# Patient Record
Sex: Female | Born: 1990 | Race: White | Hispanic: No | Marital: Married | Smoking: Never smoker
Health system: Southern US, Community
[De-identification: ages and names within clinical notes are randomized; demographics above are authoritative.]

## PROBLEM LIST (undated history)

## (undated) ENCOUNTER — Inpatient Hospital Stay (HOSPITAL_COMMUNITY): Payer: Self-pay

## (undated) DIAGNOSIS — G43909 Migraine, unspecified, not intractable, without status migrainosus: Secondary | ICD-10-CM

## (undated) DIAGNOSIS — F909 Attention-deficit hyperactivity disorder, unspecified type: Secondary | ICD-10-CM

## (undated) DIAGNOSIS — N83209 Unspecified ovarian cyst, unspecified side: Secondary | ICD-10-CM

## (undated) HISTORY — DX: Migraine, unspecified, not intractable, without status migrainosus: G43.909

## (undated) HISTORY — DX: Attention-deficit hyperactivity disorder, unspecified type: F90.9

## (undated) HISTORY — PX: WISDOM TOOTH EXTRACTION: SHX21

---
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
## 2011-06-20 ENCOUNTER — Institutional Professional Consult (permissible substitution): Payer: Self-pay | Admitting: Family Medicine

## 2011-10-24 ENCOUNTER — Encounter: Payer: Self-pay | Admitting: *Deleted

## 2011-10-31 ENCOUNTER — Encounter: Payer: Self-pay | Admitting: Family Medicine

## 2011-10-31 ENCOUNTER — Ambulatory Visit (INDEPENDENT_AMBULATORY_CARE_PROVIDER_SITE_OTHER): Payer: 59 | Admitting: Family Medicine

## 2011-10-31 VITALS — BP 124/86 | HR 80 | Ht 70.0 in | Wt 294.0 lb

## 2011-10-31 DIAGNOSIS — Z8349 Family history of other endocrine, nutritional and metabolic diseases: Secondary | ICD-10-CM

## 2011-10-31 DIAGNOSIS — L659 Nonscarring hair loss, unspecified: Secondary | ICD-10-CM

## 2011-10-31 DIAGNOSIS — Z8342 Family history of familial hypercholesterolemia: Secondary | ICD-10-CM

## 2011-10-31 DIAGNOSIS — Z6841 Body Mass Index (BMI) 40.0 and over, adult: Secondary | ICD-10-CM

## 2011-10-31 DIAGNOSIS — R5381 Other malaise: Secondary | ICD-10-CM

## 2011-10-31 DIAGNOSIS — F909 Attention-deficit hyperactivity disorder, unspecified type: Secondary | ICD-10-CM

## 2011-10-31 MED ORDER — LISDEXAMFETAMINE DIMESYLATE 50 MG PO CAPS: 50.0000 mg | ORAL_CAPSULE | ORAL | Status: DC

## 2011-10-31 NOTE — Patient Instructions (Signed)
HEALTH MAINTENANCE RECOMMENDATIONS:  It is recommended that you get at least 30 minutes of aerobic exercise at least 5 days/week (for weight loss, you may need as much as 60-90 minutes). This can be any activity that gets your heart rate up. This can be divided in 10-15 minute intervals if needed, but try and build up your endurance at least once a week.  Weight bearing exercise is also recommended twice weekly.  Eat a healthy diet with lots of vegetables, fruits and fiber.  "Colorful" foods have a lot of vitamins (ie green vegetables, tomatoes, red peppers, etc).  Limit sweet tea, regular sodas and alcoholic beverages, all of which has a lot of calories and sugar.  Up to 1 alcoholic drink daily may be beneficial for women (unless trying to lose weight, watch sugars).  Drink a lot of water.  Calcium recommendations are 1200-1500 mg daily (1500 mg for postmenopausal women or women without ovaries), and vitamin D 1000 IU daily.  This should be obtained from diet and/or supplements (vitamins), and calcium should not be taken all at once, but in divided doses.  Try and drink plenty of water, change to skim milk.  Avoid juices and gatorade. Try and get at least 8 hours of sleep each night.  Start the Vyvanse in the mornings on the days of classes.

## 2011-10-31 NOTE — Progress Notes (Signed)
Chief Complaint  Patient presents with  . Establish Care    new patient to get established. Thinks she may have possiblet thyroid problems, she cannot lose weight-no matter what she does. Having "bathroom issues." Patient states that about 30 minutes after eating any meal she has a bowel movement-not diarrhea, normal bowel movement.   HPI: She presents today accompanied by her mother, to discuss her weight and her thyroid, and also requesting to restart ADHD medication after having been off for many years.  Her pediatrician had mentioned getting thyroid checked due to her weight and family history of thyroid disease (this was years ago); has never been checked.  Denies any weight changes in the last few years.  Has been trying to lose and hasn't been able.  Went to the gym daily for a year 1-2 years ago, but didn't lose weight.  Hasn't really tried anything since then.  Plays "Just Dance" video game. Exercise--none.  +juice (Gatorade) daily.  No change in bowel habits.  Has always needed to use the bathroom to have a bowel movement shortly after eating, and sometimes even during meal.  Stools are not loose or hard, no pain or straining.  Denies blood or mucus in stool. Mostly occurs with larger meals, but mother states with any meals, and not related to dairy.  Reports being hot all the time.  Denies fatigue, but mother states she always complains about being tired--but relates that to not sleeping well at night.  Denies skin changes, but does report some hair loss and thinning.  Just recently started taking the B vitamins to help with her hair loss.  ADHD--wants to be put back on Vyvanse.  Has been off meds since 8th grade.  She refused to take it while in high school (there were some issues with mom's husband stealing her medications).  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  Denied side effects or problems on meds in past.  Records reviewed--took Adderall until she maxed out on dose, was  then changed to 50mg  of Vyvanse (2/09).  H/o migraines--none since being on OCP's  Past Medical History  Diagnosis Date  . ADHD (attention deficit hyperactivity disorder)   . Migraines    History reviewed. No pertinent past surgical history.  History   Social History  . Marital Status: Single    Spouse Name: N/A    Number of Children: N/A  . Years of Education: N/A   Occupational History  . 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 Smoking status: Never Smoker   . Smokeless tobacco: Never Used  . Alcohol Use: No  . Drug Use: No  . Sexually Active: Not on file   Other Topics Concern  . Not on file   Social History Narrative   Lives with mom, 1 dog (black lab mix).  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 Ulcers Father   . Diabetes Maternal Aunt     borderline  . Fibromyalgia Maternal Aunt   . Cancer Maternal Aunt     cervical  . Diabetes Maternal Grandmother   . Hyperlipidemia Maternal Grandmother   . Thyroid disease Maternal Grandmother     hyperthyroidism, s/p radiactive iodine treatment  . Heart disease Maternal Grandmother     mitral valve problem  . Cancer Maternal Grandmother     skin  .  Cancer Cousin     breast cancer    Current outpatient prescriptions:Multiple Vitamins-Minerals (MULTIVITAMIN WITH MINERALS) tablet, Take 1 tablet by mouth daily., Disp: , Rfl: ;  Norethin-Eth Estrad-Fe Biphas (LO LOESTRIN FE PO), Take 1 tablet by mouth daily., Disp: , Rfl: ;  pyridoxine (B-6) 200 MG tablet, Take 200 mg by mouth daily., Disp: , Rfl: ;  vitamin B-12 (CYANOCOBALAMIN) 100 MCG tablet, Take 100 mcg by mouth daily., Disp: , Rfl:   Allergies  Allergen Reactions  . Lemonade Flavor Information systems manager) Other (See Comments)    "Great Value" brand lemonade made her lips swell.   ROS:  Denies fevers, URI symptoms, chest pain, shortness of breath,  nausea, vomiting, bowel changes, urinary complaints, skin concerns. +acne, +difficulty concentrating, +fatigue--poor sleep. No weight changes. +hair loss.  No joint pains, depression  PHYSICAL EXAM: BP 124/86  Pulse 80  Ht 5\' 10"  (1.778 m)  Wt 294 lb (133.358 kg)  BMI 42.18 kg/m2  LMP 10/26/2011 Pleasant female in no distress HEENT:  PERRL, EOMI, conjunctiva clear.  OP clear Neck: no lymphadenopathy, thyromegaly or mass Heart: regular rate and rhythm without murmur Lungs: clear bilaterally Back: no spine or CVA tenderness Abdomen: soft, nontender, no organomegaly or mass Extremities: no edema, 2+ pulse Skin: moderate acne on face, inflammatory papules.  Multiple tatoos Psych: normal mood, affect, hygiene and grooming Neuro: alert and oriented.  Cranial nerves grossly intact.  Normal gait, strength, sensation  ASSESSMENT/PLAN: 1. ADHD (attention deficit hyperactivity disorder)  lisdexamfetamine (VYVANSE) 50 MG capsule  2. Morbid obesity with BMI of 40.0-44.9, adult    3. Other malaise and fatigue  Comprehensive metabolic panel, Vitamin D 25 hydroxy, TSH  4. Hair loss  CBC with Differential, TSH  5. Family history of high cholesterol  Lipid panel    ADHD--restart Vyvanse; side effects reviewed Obesity--counseled regarding dietary changes she can make, daily exercise Hair loss--check thyroid. Frequent bowel movements--this is the norm for her, reassured. Fatigue--most likely related to inadequate sleep.  Will check labs Acne--advised of OTC medications to try; can discuss possibly changing OCP's to one which may be more effective against acne with her GYN  Return for fasting labs: lipid panel, TSH, CBC, c-met, vitamin D

## 2011-11-01 ENCOUNTER — Other Ambulatory Visit: Payer: 59

## 2011-11-01 DIAGNOSIS — L659 Nonscarring hair loss, unspecified: Secondary | ICD-10-CM

## 2011-11-01 DIAGNOSIS — Z8342 Family history of familial hypercholesterolemia: Secondary | ICD-10-CM

## 2011-11-01 DIAGNOSIS — Z6841 Body Mass Index (BMI) 40.0 and over, adult: Secondary | ICD-10-CM | POA: Insufficient documentation

## 2011-11-01 DIAGNOSIS — R5381 Other malaise: Secondary | ICD-10-CM

## 2011-11-01 DIAGNOSIS — F909 Attention-deficit hyperactivity disorder, unspecified type: Secondary | ICD-10-CM | POA: Insufficient documentation

## 2011-11-01 LAB — CBC WITH DIFFERENTIAL/PLATELET
Eosinophils Absolute: 0.1 10*3/uL (ref 0.0–0.7)
Eosinophils Relative: 1 % (ref 0–5)
HCT: 39.8 % (ref 36.0–46.0)
Hemoglobin: 13.6 g/dL (ref 12.0–15.0)
Lymphocytes Relative: 36 % (ref 12–46)
Lymphs Abs: 2.5 10*3/uL (ref 0.7–4.0)
MCH: 28.5 pg (ref 26.0–34.0)
MCV: 83.3 fL (ref 78.0–100.0)
Monocytes Relative: 8 % (ref 3–12)
RBC: 4.78 MIL/uL (ref 3.87–5.11)
WBC: 6.9 10*3/uL (ref 4.0–10.5)

## 2011-11-01 LAB — COMPREHENSIVE METABOLIC PANEL
AST: 18 U/L (ref 0–37)
Albumin: 4.3 g/dL (ref 3.5–5.2)
Alkaline Phosphatase: 102 U/L (ref 39–117)
BUN: 10 mg/dL (ref 6–23)
Glucose, Bld: 86 mg/dL (ref 70–99)
Potassium: 4.4 mEq/L (ref 3.5–5.3)
Sodium: 138 mEq/L (ref 135–145)
Total Bilirubin: 0.4 mg/dL (ref 0.3–1.2)
Total Protein: 6.8 g/dL (ref 6.0–8.3)

## 2011-11-01 LAB — LIPID PANEL
HDL: 36 mg/dL — ABNORMAL LOW (ref >39)
Total CHOL/HDL Ratio: 3.7 Ratio
VLDL: 22 mg/dL (ref 0–40)

## 2011-11-02 ENCOUNTER — Encounter: Payer: Self-pay | Admitting: Family Medicine

## 2011-11-02 LAB — TSH: TSH: 2.118 u[IU]/mL (ref 0.350–4.500)

## 2015-03-06 ENCOUNTER — Encounter (HOSPITAL_COMMUNITY): Payer: Self-pay

## 2015-03-06 ENCOUNTER — Emergency Department (HOSPITAL_COMMUNITY): Payer: 59

## 2015-03-06 ENCOUNTER — Emergency Department (HOSPITAL_COMMUNITY)
Admission: EM | Admit: 2015-03-06 | Discharge: 2015-03-06 | Disposition: A | Discharge status: Home / Self Care | Payer: 59 | Attending: Emergency Medicine | Admitting: Emergency Medicine

## 2015-03-06 DIAGNOSIS — J159 Unspecified bacterial pneumonia: Secondary | ICD-10-CM | POA: Insufficient documentation

## 2015-03-06 DIAGNOSIS — Z8742 Personal history of other diseases of the female genital tract: Secondary | ICD-10-CM | POA: Insufficient documentation

## 2015-03-06 DIAGNOSIS — Z3202 Encounter for pregnancy test, result negative: Secondary | ICD-10-CM | POA: Diagnosis not present

## 2015-03-06 DIAGNOSIS — Z8679 Personal history of other diseases of the circulatory system: Secondary | ICD-10-CM | POA: Diagnosis not present

## 2015-03-06 DIAGNOSIS — F909 Attention-deficit hyperactivity disorder, unspecified type: Secondary | ICD-10-CM | POA: Insufficient documentation

## 2015-03-06 DIAGNOSIS — J189 Pneumonia, unspecified organism: Secondary | ICD-10-CM

## 2015-03-06 DIAGNOSIS — R05 Cough: Secondary | ICD-10-CM | POA: Diagnosis present

## 2015-03-06 HISTORY — DX: Unspecified ovarian cyst, unspecified side: N83.209

## 2015-03-06 LAB — CBC WITH DIFFERENTIAL/PLATELET
BASOS PCT: 0 %
Basophils Absolute: 0 10*3/uL (ref 0.0–0.1)
Eosinophils Absolute: 0.1 10*3/uL (ref 0.0–0.7)
Eosinophils Relative: 1 %
HCT: 38.4 % (ref 36.0–46.0)
HEMOGLOBIN: 12.9 g/dL (ref 12.0–15.0)
Lymphocytes Relative: 11 %
Lymphs Abs: 1.3 10*3/uL (ref 0.7–4.0)
MCH: 28.5 pg (ref 26.0–34.0)
MCHC: 33.6 g/dL (ref 30.0–36.0)
MCV: 85 fL (ref 78.0–100.0)
Monocytes Absolute: 1 10*3/uL (ref 0.1–1.0)
Monocytes Relative: 8 %
NEUTROS ABS: 9.2 10*3/uL — AB (ref 1.7–7.7)
NEUTROS PCT: 80 %
Platelets: 201 10*3/uL (ref 150–400)
RBC: 4.52 MIL/uL (ref 3.87–5.11)
RDW: 13.3 % (ref 11.5–15.5)
WBC: 11.5 10*3/uL — AB (ref 4.0–10.5)

## 2015-03-06 LAB — URINE MICROSCOPIC-ADD ON

## 2015-03-06 LAB — COMPREHENSIVE METABOLIC PANEL
ALK PHOS: 104 U/L (ref 38–126)
ALT: 18 U/L (ref 14–54)
ANION GAP: 9 (ref 5–15)
AST: 17 U/L (ref 15–41)
Albumin: 3.9 g/dL (ref 3.5–5.0)
BUN: 8 mg/dL (ref 6–20)
CALCIUM: 9 mg/dL (ref 8.9–10.3)
CO2: 21 mmol/L — AB (ref 22–32)
Chloride: 108 mmol/L (ref 101–111)
Creatinine, Ser: 0.99 mg/dL (ref 0.44–1.00)
GFR calc non Af Amer: >60 mL/min (ref >60)
Glucose, Bld: 124 mg/dL — ABNORMAL HIGH (ref 65–99)
Potassium: 4 mmol/L (ref 3.5–5.1)
SODIUM: 138 mmol/L (ref 135–145)
TOTAL PROTEIN: 7 g/dL (ref 6.5–8.1)
Total Bilirubin: 0.5 mg/dL (ref 0.3–1.2)

## 2015-03-06 LAB — URINALYSIS, ROUTINE W REFLEX MICROSCOPIC
Bilirubin Urine: NEGATIVE
Glucose, UA: NEGATIVE mg/dL
KETONES UR: 15 mg/dL — AB
LEUKOCYTES UA: NEGATIVE
NITRITE: NEGATIVE
PH: 5 (ref 5.0–8.0)
PROTEIN: 30 mg/dL — AB
Specific Gravity, Urine: 1.03 (ref 1.005–1.030)

## 2015-03-06 LAB — I-STAT BETA HCG BLOOD, ED (MC, WL, AP ONLY)

## 2015-03-06 LAB — I-STAT CG4 LACTIC ACID, ED: LACTIC ACID, VENOUS: 0.47 mmol/L — AB (ref 0.5–2.0)

## 2015-03-06 MED ORDER — LEVOFLOXACIN 750 MG PO TABS: 750.0000 mg | ORAL_TABLET | Freq: Every day | ORAL | Status: DC

## 2015-03-06 NOTE — ED Notes (Signed)
Dr. Delo at the bedside 

## 2015-03-06 NOTE — ED Provider Notes (Signed)
CSN: [id number]     Arrival date & time 03/06/15  8295 History   First MD Initiated Contact with Patient 03/06/15 [phone]     Chief Complaint  Patient presents with  . Fever  . Cough     (Consider location/radiation/quality/duration/timing/severity/associated sxs/prior Treatment) HPI Comments: Patient is an otherwise healthy 24 year old female who presents with fever, cough, congestion for the past several days. She denies any chest pain or shortness of breath.  Patient is a 24 y.o. female presenting with fever and cough. The history is provided by the patient.  Fever Max temp prior to arrival:  103 Temp source:  Oral Severity:  Moderate Onset quality:  Sudden Timing:  Constant Progression:  Unchanged Chronicity:  New Relieved by:  Nothing Worsened by:  Nothing tried Ineffective treatments:  None tried Associated symptoms: chills and cough   Cough Associated symptoms: chills and fever     Past Medical History  Diagnosis Date  . ADHD (attention deficit hyperactivity disorder)   . Migraines   . Ovarian cyst     on right side   History reviewed. No pertinent past surgical history. Family History  Problem Relation Age of Onset  . Ulcers Father   . Diabetes Maternal Aunt     borderline  . Fibromyalgia Maternal Aunt   . Cancer Maternal Aunt     cervical  . Diabetes Maternal Grandmother   . Hyperlipidemia Maternal Grandmother   . Thyroid disease Maternal Grandmother     hyperthyroidism, s/p radiactive iodine treatment  . Heart disease Maternal Grandmother     mitral valve problem  . Cancer Maternal Grandmother     skin  . Cancer Cousin     breast cancer   Social History  Substance Use Topics  . Smoking status: Never Smoker   . Smokeless tobacco: Never Used  . Alcohol Use: No   OB History    No data available     Review of Systems  Constitutional: Positive for fever and chills.  Respiratory: Positive for cough.   All other systems reviewed and are  negative.     Allergies  Review of patient's allergies indicates no known allergies.  Home Medications   Prior to Admission medications   Medication Sig Start Date End Date Taking? Authorizing Provider  lisdexamfetamine (VYVANSE) 50 MG capsule Take 1 capsule (50 mg total) by mouth every morning. Patient not taking: Reported on 03/06/2015 10/31/11   Joselyn Arrow, MD   BP 96/64 mmHg  Pulse 99  Temp(Src) 100 F (37.8 C) (Oral)  Resp 20  Ht  (1.803 m)  Wt 306 lb (138.801 kg)  BMI 42.70 kg/m2  SpO2 96%  LMP 03/05/2015 Physical Exam  Constitutional: She is oriented to person, place, and time. She appears well-developed and well-nourished. No distress.  HENT:  Head: Normocephalic and atraumatic.  Mouth/Throat: Oropharynx is clear and moist.  Neck: Normal range of motion. Neck supple.  Cardiovascular: Normal rate and regular rhythm.  Exam reveals no gallop and no friction rub.   No murmur heard. Pulmonary/Chest: Effort normal and breath sounds normal. No respiratory distress. She has no wheezes. She has no rales.  Abdominal: Soft. Bowel sounds are normal. She exhibits no distension. There is no tenderness.  Musculoskeletal: Normal range of motion. She exhibits no edema.  Neurological: She is alert and oriented to person, place, and time.  Skin: Skin is warm and dry. She is not [id number].  Nursing note and vitals reviewed.   ED Course  Procedures (  including critical care time) Labs Review Labs Reviewed  COMPREHENSIVE METABOLIC PANEL - Abnormal; Notable for the following:    CO2 21 (*)    Glucose, Bld 124 (*)    All other components within normal limits  URINALYSIS, ROUTINE W REFLEX MICROSCOPIC (NOT [facility]) - Abnormal; Notable for the following:    Color, Urine AMBER (*)    Hgb urine dipstick LARGE (*)    Ketones, ur 15 (*)    Protein, ur 30 (*)    All other components within normal limits  CBC WITH DIFFERENTIAL/PLATELET - Abnormal; Notable for the following:    WBC  11.5 (*)    Neutro Abs 9.2 (*)    All other components within normal limits  URINE MICROSCOPIC-ADD ON - Abnormal; Notable for the following:    Squamous Epithelial / LPF 0-5 (*)    Bacteria, UA RARE (*)    All other components within normal limits  I-STAT CG4 LACTIC ACID, ED - Abnormal; Notable for the following:    Lactic Acid, Venous 0.47 (*)    All other components within normal limits  URINE CULTURE  I-STAT BETA HCG BLOOD, ED ([facility], [facility], [facility] ONLY)    Imaging Review Dg Chest 2 View  03/06/2015  CLINICAL DATA:  Acute onset of cough and fever.  Initial encounter. EXAM: CHEST  2 VIEW COMPARISON:  None. FINDINGS: The lungs are well-aerated. Right upper lobe airspace opacity is compatible with pneumonia. Mild left midlung airspace opacity is also seen. There is no evidence of pleural effusion or pneumothorax. The heart is normal in size; the mediastinal contour is within normal limits. No acute osseous abnormalities are seen. IMPRESSION: Multifocal pneumonia, most prominent at the right upper lobe. Electronically Signed   By: Roanna RaiderJeffery  Chang M.D.   On: 03/06/2015 04:41   I have personally reviewed and evaluated these images and lab results as part of my medical decision-making.   EKG Interpretation None      MDM   Final diagnoses:  None    Laboratory studies are reassuring, however chest x-ray reveals a right upper lobe pneumonia. She is not hypoxic and is in no respiratory distress. She is nontoxic-appearing and I believe is appropriate for outpatient treatment. She will be treated with Levaquin and when necessary return.    Geoffery Lyonsouglas Kaliyah Gladman, MD 03/06/15 [phone]

## 2015-03-06 NOTE — ED Notes (Signed)
amb to the bathroom for urine specimen 

## 2015-03-06 NOTE — ED Notes (Signed)
Patient alert and oriented at discharge.  Patient reviewed discharge instructions and verbalized understanding.  Patient taken home with family present at bedside.

## 2015-03-06 NOTE — ED Notes (Signed)
Pt here for cough and fever since 27th. Hasn't had any other symptoms like a runny nose or nasal congestion.

## 2015-03-06 NOTE — ED Notes (Signed)
Pt went to x-ray.

## 2015-03-06 NOTE — Discharge Instructions (Signed)
Levaquin as prescribed.  Continue Tylenol 1000 mg rotated with Motrin 600 mg every 3 hours as needed for fever.  Return to the ER if he developed difficulty breathing, chest pains, or other new and concerning symptoms.   Community-Acquired Pneumonia, Adult Pneumonia is an infection of the lungs. There are different types of pneumonia. One type can develop while a person is in a hospital. A different type, called community-acquired pneumonia, develops in people who are not, or have not recently been, in the hospital or other health care facility.  CAUSES Pneumonia may be caused by bacteria, viruses, or funguses. Community-acquired pneumonia is often caused by Streptococcus pneumonia bacteria. These bacteria are often passed from one person to another by breathing in droplets from the cough or sneeze of an infected person. RISK FACTORS The condition is more likely to develop in:  People who havechronic diseases, such as chronic obstructive pulmonary disease (COPD), asthma, congestive heart failure, cystic fibrosis, diabetes, or kidney disease.  People who haveearly-stage or late-stage HIV.  People who havesickle cell disease.  People who havehad their spleen removed (splenectomy).  People who havepoor Administratordental hygiene.  People who havemedical conditions that increase the risk of breathing in (aspirating) secretions their own mouth and nose.   People who havea weakened immune system (immunocompromised).  People who smoke.  People whotravel to areas where pneumonia-causing germs commonly exist.  People whoare around animal habitats or animals that have pneumonia-causing germs, including birds, bats, rabbits, cats, and farm animals. SYMPTOMS Symptoms of this condition include:  Adry cough.  A wet (productive) cough.  Fever.  Sweating.  Chest pain, especially when breathing deeply or coughing.  Rapid breathing or difficulty breathing.  Shortness of  breath.  Shaking chills.  Fatigue.  Muscle aches. DIAGNOSIS Your health care provider will take a medical history and perform a physical exam. You may also have other tests, including:  Imaging studies of your chest, including X-rays.  Tests to check your blood oxygen level and other blood gases.  Other tests on blood, mucus (sputum), fluid around your lungs (pleural fluid), and urine. If your pneumonia is severe, other tests may be done to identify the specific cause of your illness. TREATMENT The type of treatment that you receive depends on many factors, such as the cause of your pneumonia, the medicines you take, and other medical conditions that you have. For most adults, treatment and recovery from pneumonia may occur at home. In some cases, treatment must happen in a hospital. Treatment may include:  Antibiotic medicines, if the pneumonia was caused by bacteria.  Antiviral medicines, if the pneumonia was caused by a virus.  Medicines that are given by mouth or through an IV tube.  Oxygen.  Respiratory therapy. Although rare, treating severe pneumonia may include:  Mechanical ventilation. This is done if you are not breathing well on your own and you cannot maintain a safe blood oxygen level.  Thoracentesis. This procedureremoves fluid around one lung or both lungs to help you breathe better. HOME CARE INSTRUCTIONS  Take over-the-counter and prescription medicines only as told by your health care provider.  Only takecough medicine if you are losing sleep. Understand that cough medicine can prevent your body's natural ability to remove mucus from your lungs.  If you were prescribed an antibiotic medicine, take it as told by your health care provider. Do not stop taking the antibiotic even if you start to feel better.  Sleep in a semi-upright position at night. Try sleeping in a  reclining chair, or place a few pillows under your head.  Do not use tobacco products,  including cigarettes, chewing tobacco, and e-cigarettes. If you need help quitting, ask your health care provider.  Drink enough water to keep your urine clear or pale yellow. This will help to thin out mucus secretions in your lungs. PREVENTION There are ways that you can decrease your risk of developing community-acquired pneumonia. Consider getting a pneumococcal vaccine if:  You are older than 23 years of age.  You are older than 23 years of age and are undergoing cancer treatment, have chronic lung disease, or have other medical conditions that affect your immune system. Ask your health care provider if this applies to you. There are different types and schedules of pneumococcal vaccines. Ask your health care provider which vaccination option is best for you. You may also prevent community-acquired pneumonia if you take these actions:  Get an influenza vaccine every year. Ask your health care provider which type of influenza vaccine is best for you.  Go to the dentist on a regular basis.  Wash your hands often. Use hand sanitizer if soap and water are not available. SEEK MEDICAL CARE IF:  You have a fever.  You are losing sleep because you cannot control your cough with cough medicine. SEEK IMMEDIATE MEDICAL CARE IF:  You have worsening shortness of breath.  You have increased chest pain.  Your sickness becomes worse, especially if you are an older adult or have a weakened immune system.  You cough up blood.   This information is not intended to replace advice given to you by your health care provider. Make sure you discuss any questions you have with your health care provider.   Document Released: 02/21/2005 Document Revised: 11/12/2014 Document Reviewed: 06/18/2014 Elsevier Interactive Patient Education [employer]

## 2015-03-07 LAB — URINE CULTURE

## 2015-03-08 NOTE — L&D Delivery Note (Signed)
Delivery Note At 12:58 PM a healthy female was delivered via Vaginal, Spontaneous Delivery (Presentation: LOA); loose nuchal x 1 reduced.  APGAR: 8, 9; weight  pending.   Placenta status: S/I. 3V Cord with the following complications: Chorioamnionitis-at 11 am patient spiked T 101.  She was treated with Amp/Gent and Tylenol.  Cord pH: not collect  Anesthesia:  Epidural Episiotomy:  None  Lacerations: Vaginal;Periurethral Suture Repair: 3.0 vicryl Est. Blood Loss (mL): 350  Mom to postpartum.  Baby to Couplet care / Skin to Skin.  Ginamarie Banfield, Tyrese 12/09/2015, 1:25 PM

## 2015-05-05 LAB — OB RESULTS CONSOLE ABO/RH: RH TYPE: POSITIVE

## 2015-05-05 LAB — OB RESULTS CONSOLE HEPATITIS B SURFACE ANTIGEN: Hepatitis B Surface Ag: NEGATIVE

## 2015-05-05 LAB — OB RESULTS CONSOLE RUBELLA ANTIBODY, IGM: Rubella: IMMUNE

## 2015-05-05 LAB — OB RESULTS CONSOLE ANTIBODY SCREEN: Antibody Screen: NEGATIVE

## 2015-05-05 LAB — OB RESULTS CONSOLE HIV ANTIBODY (ROUTINE TESTING): HIV: NONREACTIVE

## 2015-05-05 LAB — OB RESULTS CONSOLE RPR: RPR: NONREACTIVE

## 2015-05-18 LAB — OB RESULTS CONSOLE GC/CHLAMYDIA
CHLAMYDIA, DNA PROBE: NEGATIVE
GC PROBE AMP, GENITAL: NEGATIVE

## 2015-09-23 ENCOUNTER — Encounter (HOSPITAL_COMMUNITY): Payer: Self-pay | Admitting: *Deleted

## 2015-09-23 ENCOUNTER — Inpatient Hospital Stay (HOSPITAL_COMMUNITY)
Admission: AD | Admit: 2015-09-23 | Discharge: 2015-09-24 | Disposition: A | Discharge status: Home / Self Care | Payer: 59 | Source: Ambulatory Visit | Attending: Obstetrics and Gynecology | Admitting: Obstetrics and Gynecology

## 2015-09-23 DIAGNOSIS — O211 Hyperemesis gravidarum with metabolic disturbance: Secondary | ICD-10-CM | POA: Insufficient documentation

## 2015-09-23 DIAGNOSIS — O4703 False labor before 37 completed weeks of gestation, third trimester: Secondary | ICD-10-CM | POA: Diagnosis not present

## 2015-09-23 DIAGNOSIS — Z3A28 28 weeks gestation of pregnancy: Secondary | ICD-10-CM | POA: Insufficient documentation

## 2015-09-23 DIAGNOSIS — O26853 Spotting complicating pregnancy, third trimester: Secondary | ICD-10-CM | POA: Insufficient documentation

## 2015-09-23 LAB — URINALYSIS, ROUTINE W REFLEX MICROSCOPIC
Bilirubin Urine: NEGATIVE
Glucose, UA: NEGATIVE mg/dL
Hgb urine dipstick: NEGATIVE
Ketones, ur: NEGATIVE mg/dL
LEUKOCYTES UA: NEGATIVE
NITRITE: NEGATIVE
PH: 6 (ref 5.0–8.0)
Protein, ur: NEGATIVE mg/dL

## 2015-09-23 NOTE — MAU Note (Signed)
Pt reports cramping earlier today and some spotting about 4 hours ago and again about 1.5 hours ago.

## 2015-09-23 NOTE — MAU Note (Signed)
Notified provider that patient is not feeling any cramping or abdominal pain anymore. Provider said to discharge patient with instructions to drink plenty fluids.

## 2015-09-23 NOTE — H&P (Signed)
25 year old G 1 P 0 at 2728 w 6 days presents with cramping this afternoon - intermittent with 2 episodes of spotting.  No active vaginal bleeding.  Normal fetal movement. Patient admits to drinking very little water today  BP 121/67 mmHg  Pulse 97  Temp(Src) 99.1 F (37.3 C) (Oral)  Resp 20  Ht 5\' 11"  (1.803 m)  Wt 304 lb (137.893 kg)  BMI 42.42 kg/m2  SpO2 98%  LMP 03/05/2015 Category 1 Fetus Toco irritability  Cervix is Long / Closed/ vertex  IMPRESSION: IUP at 28 w 6 days Dehydration Uterine irritability  PLAN: Aggressive po hydration - here and discussed with patient Discharge home after period of observation

## 2015-09-23 NOTE — Discharge Instructions (Signed)

## 2015-11-11 LAB — OB RESULTS CONSOLE GBS: STREP GROUP B AG: NEGATIVE

## 2015-12-05 ENCOUNTER — Inpatient Hospital Stay (HOSPITAL_COMMUNITY)
Admission: AD | Admit: 2015-12-05 | Discharge: 2015-12-05 | Disposition: A | Discharge status: Home / Self Care | Payer: 59 | Source: Ambulatory Visit | Attending: Obstetrics and Gynecology | Admitting: Obstetrics and Gynecology

## 2015-12-05 ENCOUNTER — Encounter (HOSPITAL_COMMUNITY): Payer: Self-pay | Admitting: *Deleted

## 2015-12-05 NOTE — Discharge Instructions (Signed)
Braxton Hicks Contractions °Contractions of the uterus can occur throughout pregnancy. Contractions are not always a sign that you are in labor.  °WHAT ARE BRAXTON HICKS CONTRACTIONS?  °Contractions that occur before labor are called Braxton Hicks contractions, or false labor. Toward the end of pregnancy (32-34 weeks), these contractions can develop more often and may become more forceful. This is not true labor because these contractions do not result in opening (dilatation) and thinning of the cervix. They are sometimes difficult to tell apart from true labor because these contractions can be forceful and people have different pain tolerances. You should not feel embarrassed if you go to the hospital with false labor. Sometimes, the only way to tell if you are in true labor is for your health care provider to look for changes in the cervix. °If there are no prenatal problems or other health problems associated with the pregnancy, it is completely safe to be sent home with false labor and await the onset of true labor. °HOW CAN YOU TELL THE DIFFERENCE BETWEEN TRUE AND FALSE LABOR? °False Labor °· The contractions of false labor are usually shorter and not as hard as those of true labor.   °· The contractions are usually irregular.   °· The contractions are often felt in the front of the lower abdomen and in the groin.   °· The contractions may go away when you walk around or change positions while lying down.   °· The contractions get weaker and are shorter lasting as time goes on.   °· The contractions do not usually become progressively stronger, regular, and closer together as with true labor.   °True Labor °· Contractions in true labor last 30-70 seconds, become very regular, usually become more intense, and increase in frequency.   °· The contractions do not go away with walking.   °· The discomfort is usually felt in the top of the uterus and spreads to the lower abdomen and low back.   °· True labor can be  determined by your health care provider with an exam. This will show that the cervix is dilating and getting thinner.   °WHAT TO REMEMBER °· Keep up with your usual exercises and follow other instructions given by your health care provider.   °· Take medicines as directed by your health care provider.   °· Keep your regular prenatal appointments.   °· Eat and drink lightly if you think you are going into labor.   °· If Braxton Hicks contractions are making you uncomfortable:   °¨ Change your position from lying down or resting to walking, or from walking to resting.   °¨ Sit and rest in a tub of warm water.   °¨ Drink 2-3 glasses of water. Dehydration may cause these contractions.   °¨ Do slow and deep breathing several times an hour.   °WHEN SHOULD I SEEK IMMEDIATE MEDICAL CARE? °Seek immediate medical care if: °· Your contractions become stronger, more regular, and closer together.   °· You have fluid leaking or gushing from your vagina.   °· You have a fever.   °· You pass blood-tinged mucus.   °· You have vaginal bleeding.   °· You have continuous abdominal pain.   °· You have low back pain that you never had before.   °· You feel your baby's head pushing down and causing pelvic pressure.   °· Your baby is not moving as much as it used to.   °  °This information is not intended to replace advice given to you by your health care provider. Make sure you discuss any questions you have with your health care   provider. °  °Document Released: 02/21/2005 Document Revised: 02/26/2013 Document Reviewed: 12/03/2012 °Elsevier Interactive Patient Education ©2016 Else[employer] Inc[employer] ° °

## 2015-12-05 NOTE — MAU Note (Addendum)
Contractions since 1500. Denies LOF or bleeding. Some mucousy d/c earlier today. Only occ ctx is uncomfortable.

## 2015-12-07 ENCOUNTER — Encounter (HOSPITAL_COMMUNITY): Payer: Self-pay | Admitting: *Deleted

## 2015-12-07 ENCOUNTER — Inpatient Hospital Stay (HOSPITAL_COMMUNITY)
Admission: AD | Admit: 2015-12-07 | Discharge: 2015-12-07 | Disposition: A | Discharge status: Home / Self Care | Payer: 59 | Source: Ambulatory Visit | Attending: Obstetrics and Gynecology | Admitting: Obstetrics and Gynecology

## 2015-12-07 LAB — URINALYSIS, ROUTINE W REFLEX MICROSCOPIC
Bilirubin Urine: NEGATIVE
GLUCOSE, UA: NEGATIVE mg/dL
HGB URINE DIPSTICK: NEGATIVE
Ketones, ur: NEGATIVE mg/dL
Nitrite: NEGATIVE
PH: 6 (ref 5.0–8.0)
PROTEIN: NEGATIVE mg/dL

## 2015-12-07 LAB — URINE MICROSCOPIC-ADD ON: RBC / HPF: NONE SEEN RBC/hpf (ref 0–5)

## 2015-12-07 LAB — POCT FERN TEST: POCT Fern Test: NEGATIVE

## 2015-12-07 LAB — AMNISURE RUPTURE OF MEMBRANE (ROM) NOT AT ARMC: Amnisure ROM: NEGATIVE

## 2015-12-07 NOTE — Discharge Instructions (Signed)
Fetal Movement Counts °Patient Name: __________________________________________________ Patient Due Date: ____________________ °Performing a fetal movement count is highly recommended in high-risk pregnancies, but it is good for every pregnant woman to do. Your health care provider may ask you to start counting fetal movements at 28 weeks of the pregnancy. Fetal movements often increase: °· After eating a full meal. °· After physical activity. °· After eating or drinking something sweet or cold. °· At rest. °Pay attention to when you feel the baby is most active. This will help you notice a pattern of your baby's sleep and wake cycles and what factors contribute to an increase in fetal movement. It is important to perform a fetal movement count at the same time each day when your baby is normally most active.  °HOW TO COUNT FETAL MOVEMENTS °1. Find a quiet and comfortable area to sit or lie down on your left side. Lying on your left side provides the best blood and oxygen circulation to your baby. °2. Write down the day and time on a sheet of paper or in a journal. °3. Start counting kicks, flutters, swishes, rolls, or jabs in a 2-hour period. You should feel at least 10 movements within 2 hours. °4. If you do not feel 10 movements in 2 hours, wait 2-3 hours and count again. Look for a change in the pattern or not enough counts in 2 hours. °SEEK MEDICAL CARE IF: °· You feel less than 10 counts in 2 hours, tried twice. °· There is no movement in over an hour. °· The pattern is changing or taking longer each day to reach 10 counts in 2 hours. °· You feel the baby is not moving as he or she usually does. °Date: ____________ Movements: ____________ Start time: ____________ Finish time: ____________  °Date: ____________ Movements: ____________ Start time: ____________ Finish time: ____________ °Date: ____________ Movements: ____________ Start time: ____________ Finish time: ____________ °Date: ____________ Movements:  ____________ Start time: ____________ Finish time: ____________ °Date: ____________ Movements: ____________ Start time: ____________ Finish time: ____________ °Date: ____________ Movements: ____________ Start time: ____________ Finish time: ____________ °Date: ____________ Movements: ____________ Start time: ____________ Finish time: ____________ °Date: ____________ Movements: ____________ Start time: ____________ Finish time: ____________  °Date: ____________ Movements: ____________ Start time: ____________ Finish time: ____________ °Date: ____________ Movements: ____________ Start time: ____________ Finish time: ____________ °Date: ____________ Movements: ____________ Start time: ____________ Finish time: ____________ °Date: ____________ Movements: ____________ Start time: ____________ Finish time: ____________ °Date: ____________ Movements: ____________ Start time: ____________ Finish time: ____________ °Date: ____________ Movements: ____________ Start time: ____________ Finish time: ____________ °Date: ____________ Movements: ____________ Start time: ____________ Finish time: ____________  °Date: ____________ Movements: ____________ Start time: ____________ Finish time: ____________ °Date: ____________ Movements: ____________ Start time: ____________ Finish time: ____________ °Date: ____________ Movements: ____________ Start time: ____________ Finish time: ____________ °Date: ____________ Movements: ____________ Start time: ____________ Finish time: ____________ °Date: ____________ Movements: ____________ Start time: ____________ Finish time: ____________ °Date: ____________ Movements: ____________ Start time: ____________ Finish time: ____________ °Date: ____________ Movements: ____________ Start time: ____________ Finish time: ____________  °Date: ____________ Movements: ____________ Start time: ____________ Finish time: ____________ °Date: ____________ Movements: ____________ Start time: ____________ Finish  time: ____________ °Date: ____________ Movements: ____________ Start time: ____________ Finish time: ____________ °Date: ____________ Movements: ____________ Start time: ____________ Finish time: ____________ °Date: ____________ Movements: ____________ Start time: ____________ Finish time: ____________ °Date: ____________ Movements: ____________ Start time: ____________ Finish time: ____________ °Date: ____________ Movements: ____________ Start time: ____________ Finish time: ____________  °Date: ____________ Movements: ____________ Start time: ____________ Finish   time: ____________ °Date: ____________ Movements: ____________ Start time: ____________ Finish time: ____________ °Date: ____________ Movements: ____________ Start time: ____________ Finish time: ____________ °Date: ____________ Movements: ____________ Start time: ____________ Finish time: ____________ °Date: ____________ Movements: ____________ Start time: ____________ Finish time: ____________ °Date: ____________ Movements: ____________ Start time: ____________ Finish time: ____________ °Date: ____________ Movements: ____________ Start time: ____________ Finish time: ____________  °Date: ____________ Movements: ____________ Start time: ____________ Finish time: ____________ °Date: ____________ Movements: ____________ Start time: ____________ Finish time: ____________ °Date: ____________ Movements: ____________ Start time: ____________ Finish time: ____________ °Date: ____________ Movements: ____________ Start time: ____________ Finish time: ____________ °Date: ____________ Movements: ____________ Start time: ____________ Finish time: ____________ °Date: ____________ Movements: ____________ Start time: ____________ Finish time: ____________ °Date: ____________ Movements: ____________ Start time: ____________ Finish time: ____________  °Date: ____________ Movements: ____________ Start time: ____________ Finish time: ____________ °Date: ____________  Movements: ____________ Start time: ____________ Finish time: ____________ °Date: ____________ Movements: ____________ Start time: ____________ Finish time: ____________ °Date: ____________ Movements: ____________ Start time: ____________ Finish time: ____________ °Date: ____________ Movements: ____________ Start time: ____________ Finish time: ____________ °Date: ____________ Movements: ____________ Start time: ____________ Finish time: ____________ °Date: ____________ Movements: ____________ Start time: ____________ Finish time: ____________  °Date: ____________ Movements: ____________ Start time: ____________ Finish time: ____________ °Date: ____________ Movements: ____________ Start time: ____________ Finish time: ____________ °Date: ____________ Movements: ____________ Start time: ____________ Finish time: ____________ °Date: ____________ Movements: ____________ Start time: ____________ Finish time: ____________ °Date: ____________ Movements: ____________ Start time: ____________ Finish time: ____________ °Date: ____________ Movements: ____________ Start time: ____________ Finish time: ____________ °  °This information is not intended to replace advice given to you by your health care provider. Make sure you discuss any questions you have with your health care provider. °  °Document Released: 03/23/2006 Document Revised: 03/14/2014 Document Reviewed: 12/19/2011 °Elsevier Interactive Patient Education ©2016 [employer] °Braxton Hicks Contractions °Contractions of the uterus can occur throughout pregnancy. Contractions are not always a sign that you are in labor.  °WHAT ARE BRAXTON HICKS CONTRACTIONS?  °Contractions that occur before labor are called Braxton Hicks contractions, or false labor. Toward the end of pregnancy (32-34 weeks), these contractions can develop more often and may become more forceful. This is not true labor because these contractions do not result in opening (dilatation) and thinning of  the cervix. They are sometimes difficult to tell apart from true labor because these contractions can be forceful and people have different pain tolerances. You should not feel embarrassed if you go to the hospital with false labor. Sometimes, the only way to tell if you are in true labor is for your health care provider to look for changes in the cervix. °If there are no prenatal problems or other health problems associated with the pregnancy, it is completely safe to be sent home with false labor and await the onset of true labor. °HOW CAN YOU TELL THE DIFFERENCE BETWEEN TRUE AND FALSE LABOR? °False Labor °· The contractions of false labor are usually shorter and not as hard as those of true labor.   °· The contractions are usually irregular.   °· The contractions are often felt in the front of the lower abdomen and in the groin.   °· The contractions may go away when you walk around or change positions while lying down.   °· The contractions get weaker and are shorter lasting as time goes on.   °· The contractions do not usually become progressively stronger, regular, and closer together as with true labor.   °True Labor °· Contractions in true   labor last 30-70 seconds, become very regular, usually become more intense, and increase in frequency.   °· The contractions do not go away with walking.   °· The discomfort is usually felt in the top of the uterus and spreads to the lower abdomen and low back.   °· True labor can be determined by your health care provider with an exam. This will show that the cervix is dilating and getting thinner.   °WHAT TO REMEMBER °· Keep up with your usual exercises and follow other instructions given by your health care provider.   °· Take medicines as directed by your health care provider.   °· Keep your regular prenatal appointments.   °· Eat and drink lightly if you think you are going into labor.   °· If Braxton Hicks contractions are making you uncomfortable:   °¨ Change your  position from lying down or resting to walking, or from walking to resting.   °¨ Sit and rest in a tub of warm water.   °¨ Drink 2-3 glasses of water. Dehydration may cause these contractions.   °¨ Do slow and deep breathing several times an hour.   °WHEN SHOULD I SEEK IMMEDIATE MEDICAL CARE? °Seek immediate medical care if: °· Your contractions become stronger, more regular, and closer together.   °· You have fluid leaking or gushing from your vagina.   °· You have a fever.   °· You pass blood-tinged mucus.   °· You have vaginal bleeding.   °· You have continuous abdominal pain.   °· You have low back pain that you never had before.   °· You feel your baby's head pushing down and causing pelvic pressure.   °· Your baby is not moving as much as it used to.   °  °This information is not intended to replace advice given to you by your health care provider. Make sure you discuss any questions you have with your health care provider. °  °Document Released: 02/21/2005 Document Revised: 02/26/2013 Document Reviewed: 12/03/2012 °Elsevier Interactive Patient Education ©2016 [employer] ° °

## 2015-12-07 NOTE — MAU Note (Signed)
Patient states she has been "leaking" for past two days, but thought it might just be sweat.  Leaking just enough to "dampen underwear."  No bleeding.  Reports +fetal movement.  Having some pelvic pain and pressure.

## 2015-12-09 ENCOUNTER — Encounter (HOSPITAL_COMMUNITY): Payer: Self-pay

## 2015-12-09 ENCOUNTER — Inpatient Hospital Stay (HOSPITAL_COMMUNITY)
Admission: AD | Admit: 2015-12-09 | Discharge: 2015-12-11 | DRG: 775 | Disposition: A | Discharge status: Home / Self Care | Payer: 59 | Source: Ambulatory Visit | Attending: Obstetrics & Gynecology | Admitting: Obstetrics & Gynecology

## 2015-12-09 ENCOUNTER — Inpatient Hospital Stay (HOSPITAL_COMMUNITY): Payer: 59 | Admitting: Anesthesiology

## 2015-12-09 DIAGNOSIS — Z6841 Body Mass Index (BMI) 40.0 and over, adult: Secondary | ICD-10-CM | POA: Diagnosis not present

## 2015-12-09 DIAGNOSIS — Z3A39 39 weeks gestation of pregnancy: Secondary | ICD-10-CM | POA: Diagnosis not present

## 2015-12-09 DIAGNOSIS — Z3403 Encounter for supervision of normal first pregnancy, third trimester: Secondary | ICD-10-CM | POA: Diagnosis present

## 2015-12-09 DIAGNOSIS — O99214 Obesity complicating childbirth: Secondary | ICD-10-CM | POA: Diagnosis present

## 2015-12-09 DIAGNOSIS — O411230 Chorioamnionitis, third trimester, not applicable or unspecified: Secondary | ICD-10-CM | POA: Diagnosis present

## 2015-12-09 DIAGNOSIS — Z3490 Encounter for supervision of normal pregnancy, unspecified, unspecified trimester: Secondary | ICD-10-CM

## 2015-12-09 LAB — CBC
HCT: 36.7 % (ref 36.0–46.0)
HEMOGLOBIN: 12.9 g/dL (ref 12.0–15.0)
MCH: 29.1 pg (ref 26.0–34.0)
MCHC: 35.1 g/dL (ref 30.0–36.0)
MCV: 82.7 fL (ref 78.0–100.0)
PLATELETS: 214 10*3/uL (ref 150–400)
RBC: 4.44 MIL/uL (ref 3.87–5.11)
RDW: 14.2 % (ref 11.5–15.5)
WBC: 17.4 10*3/uL — ABNORMAL HIGH (ref 4.0–10.5)

## 2015-12-09 LAB — TYPE AND SCREEN
ABO/RH(D): O POS
Antibody Screen: NEGATIVE

## 2015-12-09 LAB — RPR: RPR: NONREACTIVE

## 2015-12-09 LAB — ABO/RH: ABO/RH(D): O POS

## 2015-12-09 MED ORDER — OXYCODONE-ACETAMINOPHEN 5-325 MG PO TABS: 1.0000 | ORAL_TABLET | ORAL | Status: DC | PRN

## 2015-12-09 MED ORDER — BUTORPHANOL TARTRATE 1 MG/ML IJ SOLN: 1.0000 mg | INTRAMUSCULAR | Status: DC | PRN

## 2015-12-09 MED ORDER — SODIUM CHLORIDE 0.9 % IV SOLN
2.0000 g | Freq: Four times a day (QID) | INTRAVENOUS | Status: AC
  Administered 2015-12-09: 2 g via INTRAVENOUS
  Filled 2015-12-09: qty 2000

## 2015-12-09 MED ORDER — OXYCODONE-ACETAMINOPHEN 5-325 MG PO TABS: 2.0000 | ORAL_TABLET | ORAL | Status: DC | PRN

## 2015-12-09 MED ORDER — SIMETHICONE 80 MG PO CHEW: 80.0000 mg | CHEWABLE_TABLET | ORAL | Status: DC | PRN

## 2015-12-09 MED ORDER — LACTATED RINGERS IV SOLN
INTRAVENOUS | Status: DC
  Administered 2015-12-09: 03:00:00 via INTRAVENOUS

## 2015-12-09 MED ORDER — WITCH HAZEL-GLYCERIN EX PADS: 1.0000 "application " | MEDICATED_PAD | CUTANEOUS | Status: DC | PRN

## 2015-12-09 MED ORDER — OXYTOCIN 40 UNITS IN LACTATED RINGERS INFUSION - SIMPLE MED
2.5000 [IU]/h | INTRAVENOUS | Status: DC
  Filled 2015-12-09: qty 1000

## 2015-12-09 MED ORDER — OXYTOCIN BOLUS FROM INFUSION
500.0000 mL | Freq: Once | INTRAVENOUS | Status: AC
  Administered 2015-12-09: 500 mL via INTRAVENOUS

## 2015-12-09 MED ORDER — GENTAMICIN SULFATE 40 MG/ML IJ SOLN
2.0000 mg/kg | Freq: Three times a day (TID) | INTRAMUSCULAR | Status: DC
  Administered 2015-12-09: 290 mg via INTRAVENOUS
  Filled 2015-12-09 (×2): qty 7.25

## 2015-12-09 MED ORDER — EPHEDRINE 5 MG/ML INJ
10.0000 mg | INTRAVENOUS | Status: DC | PRN
  Filled 2015-12-09: qty 4

## 2015-12-09 MED ORDER — BENZOCAINE-MENTHOL 20-0.5 % EX AERO: 1.0000 "application " | INHALATION_SPRAY | CUTANEOUS | Status: DC | PRN

## 2015-12-09 MED ORDER — TETANUS-DIPHTH-ACELL PERTUSSIS 5-2.5-18.5 LF-MCG/0.5 IM SUSP: 0.5000 mL | Freq: Once | INTRAMUSCULAR | Status: DC

## 2015-12-09 MED ORDER — ACETAMINOPHEN 325 MG PO TABS
650.0000 mg | ORAL_TABLET | ORAL | Status: DC | PRN
Start: 2015-12-09 — End: 2015-12-11

## 2015-12-09 MED ORDER — LIDOCAINE HCL (PF) 1 % IJ SOLN
INTRAMUSCULAR | Status: DC | PRN
  Administered 2015-12-09: 8 mL via EPIDURAL
  Administered 2015-12-09: 5 mL via EPIDURAL

## 2015-12-09 MED ORDER — FENTANYL 2.5 MCG/ML BUPIVACAINE 1/10 % EPIDURAL INFUSION (WH - ANES)
INTRAMUSCULAR | Status: AC
  Filled 2015-12-09: qty 125

## 2015-12-09 MED ORDER — LACTATED RINGERS IV SOLN
500.0000 mL | Freq: Once | INTRAVENOUS | Status: AC
  Administered 2015-12-09: 500 mL via INTRAVENOUS

## 2015-12-09 MED ORDER — ONDANSETRON HCL 4 MG/2ML IJ SOLN
4.0000 mg | Freq: Four times a day (QID) | INTRAMUSCULAR | Status: DC | PRN
  Administered 2015-12-09: 4 mg via INTRAVENOUS
  Filled 2015-12-09: qty 2

## 2015-12-09 MED ORDER — DIPHENHYDRAMINE HCL 50 MG/ML IJ SOLN: 12.5000 mg | INTRAMUSCULAR | Status: DC | PRN

## 2015-12-09 MED ORDER — ZOLPIDEM TARTRATE 5 MG PO TABS: 5.0000 mg | ORAL_TABLET | Freq: Every evening | ORAL | Status: DC | PRN

## 2015-12-09 MED ORDER — LIDOCAINE HCL (PF) 1 % IJ SOLN
30.0000 mL | INTRAMUSCULAR | Status: DC | PRN
  Filled 2015-12-09: qty 30

## 2015-12-09 MED ORDER — ONDANSETRON HCL 4 MG/2ML IJ SOLN: 4.0000 mg | INTRAMUSCULAR | Status: DC | PRN

## 2015-12-09 MED ORDER — SENNOSIDES-DOCUSATE SODIUM 8.6-50 MG PO TABS
2.0000 | ORAL_TABLET | ORAL | Status: DC
  Administered 2015-12-10: 2 via ORAL
  Filled 2015-12-09 (×2): qty 2

## 2015-12-09 MED ORDER — ACETAMINOPHEN 325 MG PO TABS
650.0000 mg | ORAL_TABLET | ORAL | Status: DC | PRN
  Administered 2015-12-09: 650 mg via ORAL
  Filled 2015-12-09: qty 2

## 2015-12-09 MED ORDER — PHENYLEPHRINE 40 MCG/ML (10ML) SYRINGE FOR IV PUSH (FOR BLOOD PRESSURE SUPPORT)
80.0000 ug | PREFILLED_SYRINGE | INTRAVENOUS | Status: DC | PRN
  Filled 2015-12-09: qty 5
  Filled 2015-12-09: qty 10

## 2015-12-09 MED ORDER — FLEET ENEMA 7-19 GM/118ML RE ENEM: 1.0000 | ENEMA | Freq: Once | RECTAL | Status: DC

## 2015-12-09 MED ORDER — SODIUM CHLORIDE 0.9 % IV SOLN
2.0000 g | Freq: Four times a day (QID) | INTRAVENOUS | Status: DC
  Administered 2015-12-09: 2 g via INTRAVENOUS
  Filled 2015-12-09 (×3): qty 2000

## 2015-12-09 MED ORDER — PHENYLEPHRINE 40 MCG/ML (10ML) SYRINGE FOR IV PUSH (FOR BLOOD PRESSURE SUPPORT)
80.0000 ug | PREFILLED_SYRINGE | INTRAVENOUS | Status: DC | PRN
  Filled 2015-12-09: qty 5

## 2015-12-09 MED ORDER — IBUPROFEN 600 MG PO TABS
600.0000 mg | ORAL_TABLET | Freq: Four times a day (QID) | ORAL | Status: DC
  Administered 2015-12-09 – 2015-12-11 (×8): 600 mg via ORAL
  Filled 2015-12-09 (×8): qty 1

## 2015-12-09 MED ORDER — PHENYLEPHRINE 40 MCG/ML (10ML) SYRINGE FOR IV PUSH (FOR BLOOD PRESSURE SUPPORT)
PREFILLED_SYRINGE | INTRAVENOUS | Status: AC
  Filled 2015-12-09: qty 20

## 2015-12-09 MED ORDER — ONDANSETRON HCL 4 MG PO TABS
4.0000 mg | ORAL_TABLET | ORAL | Status: DC | PRN
Start: 2015-12-09 — End: 2015-12-11

## 2015-12-09 MED ORDER — COCONUT OIL OIL: 1.0000 "application " | TOPICAL_OIL | Status: DC | PRN

## 2015-12-09 MED ORDER — SOD CITRATE-CITRIC ACID 500-334 MG/5ML PO SOLN: 30.0000 mL | ORAL | Status: DC | PRN

## 2015-12-09 MED ORDER — LACTATED RINGERS IV SOLN: 500.0000 mL | INTRAVENOUS | Status: DC | PRN

## 2015-12-09 MED ORDER — DIPHENHYDRAMINE HCL 25 MG PO CAPS: 25.0000 mg | ORAL_CAPSULE | Freq: Four times a day (QID) | ORAL | Status: DC | PRN

## 2015-12-09 MED ORDER — PRENATAL MULTIVITAMIN CH
1.0000 | ORAL_TABLET | Freq: Every day | ORAL | Status: DC
  Administered 2015-12-10 – 2015-12-11 (×2): 1 via ORAL
  Filled 2015-12-09 (×2): qty 1

## 2015-12-09 MED ORDER — FENTANYL 2.5 MCG/ML BUPIVACAINE 1/10 % EPIDURAL INFUSION (WH - ANES)
14.0000 mL/h | INTRAMUSCULAR | Status: DC | PRN
  Administered 2015-12-09 (×2): 14 mL/h via EPIDURAL
  Filled 2015-12-09: qty 125

## 2015-12-09 MED ORDER — DIBUCAINE 1 % RE OINT: 1.0000 "application " | TOPICAL_OINTMENT | RECTAL | Status: DC | PRN

## 2015-12-09 MED ORDER — DEXTROSE 5 % IV SOLN
2.0000 mg/kg | Freq: Three times a day (TID) | INTRAVENOUS | Status: AC
  Administered 2015-12-09: 290 mg via INTRAVENOUS
  Filled 2015-12-09: qty 7.25

## 2015-12-09 NOTE — MAU Note (Signed)
Notified provider that patient is here for a labor eval. Patient was 2.5 and is now 3 and very uncomfortable. Provider said to admit patient to l&d.

## 2015-12-09 NOTE — MAU Note (Signed)
Pt presents complaining of contractions all day. Denies leaking or bleeding.

## 2015-12-09 NOTE — Lactation Note (Signed)
This note was copied from a baby's chart. Lactation Consultation Note  Patient Name: Victoria [name]: 12/09/2015 Reason for consult: Initial assessment   Initial consult with first time mom or < 1 hour old infant. Infant was STS with mom and quietly alert. Mom report she just came off the breast after nursing for 5 minutes. Infant temp initially > 104 degrees, his current temp was 98.6 degrees, blanket was laid over him.   Assisted mom with latching infant to left breast in the laid back cross cradle hold. He latched easily with flanged lips, rhythmic suckling and intermittent swallows. Infant fed for another 15 minutes and then came off the breast at which time he was taken for weight and measurements.   Mom with soft compressible breast and everted nipples. Colostrum easily expressible from both breasts. Showed mom how to hand express and enc her to hand express prior to each feeding. Enc mom to feed infant 8-12 x in 24 hours at first feeding cues. BF basics, positioning and pillow support reviewed. Reviewed colostrum, milk coming to volume and supply and demand. Enc mom to call out to desk when feeding assistance is needed.   BF Resources Handout and LC Brochure given, mom informed of IP/OP Services, BF Support Groups and LC phone #. Follow up tomorrow and prn.    Maternal Data Formula Feeding for Exclusion: No Has patient been taught Hand Expression?: Yes Does the patient have breastfeeding experience prior to this delivery?: No  Feeding Feeding Type: Breast Fed Length of feed: 20 min  LATCH Score/Interventions Latch: Grasps breast easily, tongue down, lips flanged, rhythmical sucking.  Audible Swallowing: Spontaneous and intermittent  Type of Nipple: Everted at rest and after stimulation  Comfort (Breast/Nipple): Soft / non-tender     Hold (Positioning): Assistance needed to correctly position infant at breast and maintain latch. Intervention(s): Breastfeeding  basics reviewed;Support Pillows;Position options;Skin to skin  LATCH Score: 9  Lactation Tools Discussed/Used WIC Program: No   Consult Status Consult Status: Follow-up [name]: 12/10/15 Follow-up type: In-patient    Silas FloodSharon S Compton Brigance 12/09/2015, 2:18 PM

## 2015-12-09 NOTE — H&P (Signed)
Linard MillersMegan E Everding is a 25 y.o. female presenting for labor.  The patient changed her cvx from 2.5 to 3 cm in MAU and was admitted to L&D.  She received her epidural after arrival to L&D and is currently comfortable.  Antepartum course uncomplicated.  GBS negative.  OB History    Gravida Para Term Preterm AB Living   1             SAB TAB Ectopic Multiple Live Births                 Past Medical History:  Diagnosis Date  . ADHD (attention deficit hyperactivity disorder)   . Migraines   . Ovarian cyst    on right side   Past Surgical History:  Procedure Laterality Date  . WISDOM TOOTH EXTRACTION     Family History: family history includes Cancer in her cousin, maternal aunt, and maternal grandmother; Diabetes in her maternal aunt and maternal grandmother; Fibromyalgia in her maternal aunt; Heart disease in her maternal grandmother; Hyperlipidemia in her maternal grandmother; Thyroid disease in her maternal grandmother; Ulcers in her father. Social History:  reports that she has never smoked. She has never used smokeless tobacco. She reports that she does not drink alcohol or use drugs.     Maternal Diabetes: No Genetic Screening: Normal Maternal Ultrasounds/Referrals: Normal Fetal Ultrasounds or other Referrals:  None Maternal Substance Abuse:  No Significant Maternal Medications:  None Significant Maternal Lab Results:  Lab values include: Group B Strep negative Other Comments:  None  ROS Maternal Medical History:  Reason for admission: Contractions.   Contractions: Onset was more than 2 days ago.   Frequency: regular.   Perceived severity is moderate.    Fetal activity: Perceived fetal activity is normal.   Last perceived fetal movement was within the past hour.    Prenatal complications: no prenatal complications Prenatal Complications - Diabetes: none.    Dilation: 5 Effacement (%): 70 Station: -2 Exam by:: Dr. Langston MaskerMorris Blood pressure 122/70, pulse 99, temperature  98.5 F (36.9 C), temperature source Axillary, resp. rate 18, height 5\' 11"  (1.803 m), weight (!) 321 lb (145.6 kg), last menstrual period 03/05/2015. Maternal Exam:  Uterine Assessment: Contraction strength is moderate.  Contraction frequency is regular.   Abdomen: Fundal height is c/w dates.   Estimated fetal weight is 8#.   Fetal presentation: vertex  Introitus: Normal vulva. Amniotic fluid character: clear.  Pelvis: adequate for delivery.   Cervix: Cervix evaluated by digital exam.     Physical Exam  Constitutional: She is oriented to person, place, and time. She appears well-developed and well-nourished.  GI: Soft. There is no rebound and no guarding.  Neurological: She is alert and oriented to person, place, and time.  Skin: Skin is warm and dry.  Psychiatric: She has a normal mood and affect. Her behavior is normal.    Prenatal labs: ABO, Rh: --/--/O POS, O POS (10/04 0245) Antibody: NEG (10/04 0245) Rubella: Immune (02/28 0000) RPR: Nonreactive (02/28 0000)  HBsAg: Negative (02/28 0000)  HIV: Non-reactive (02/28 0000)  GBS: Negative (09/06 0000)   Assessment/Plan: 24yo G1 at 3330w6d with labor -Add pitocin prn -Anticipate NSVD   Nikeshia Keetch, Maiana 12/09/2015, 7:59 AM

## 2015-12-09 NOTE — Anesthesia Procedure Notes (Signed)
Epidural Patient location during procedure: OB Start time: 12/09/2015 3:45 AM End time: 12/09/2015 3:49 AM  Staffing Anesthesiologist: Leilani AbleHATCHETT, Ranyah Groeneveld Performed: anesthesiologist   Preanesthetic Checklist Completed: patient identified, surgical consent, pre-op evaluation, timeout performed, IV checked, risks and benefits discussed and monitors and equipment checked  Epidural Patient position: sitting Prep: site prepped and draped and DuraPrep Patient monitoring: continuous pulse ox and blood pressure Approach: midline Location: L3-L4 Injection technique: LOR air  Needle:  Needle type: Tuohy  Needle gauge: 17 G Needle length: 9 cm and 9 Needle insertion depth: 7 cm Catheter type: closed end flexible Catheter size: 19 Gauge Catheter at skin depth: 12 cm Test dose: negative and Other  Assessment Sensory level: T9 Events: blood not aspirated, injection not painful, no injection resistance, negative IV test and no paresthesia  Additional Notes Reason for block:procedure for pain

## 2015-12-09 NOTE — Anesthesia Pain Management Evaluation Note (Signed)
  CRNA Pain Management Visit Note  Patient: Victoria [name], 25 y.o., female  "Hello [facility]. We have an anesthesia team available at all times to provide care throughout the hospital, including epidural management and anesthesia for C-section. I don't know your plan for the delivery whether it a natural birth, water birth, IV sedation, nitrous supplementation, doula or epidural, but we want to meet your pain goals."   1.Was your pain managed to your expectations on prior hospitalizations?   No prior hospitalizations  2.What is your expectation for pain management during this hospitalization?     Epidural  3.How can we help you reach that goal? Epidural in situ.  Record the patient's initial score and the patient's pain goal.   Pain: 2  Pain Goal: 4 [facility] wants you to be able to say your pain was always managed very well.  Victoria [name] 12/09/2015

## 2015-12-09 NOTE — Anesthesia Preprocedure Evaluation (Signed)
Anesthesia Evaluation  Patient identified by MRN, date of birth, ID band Patient awake    Reviewed: Allergy & Precautions, H&P , NPO status , Patient's Chart, lab work & pertinent test results  Airway Mallampati: II  TM Distance: >3 FB Neck ROM: full    Dental no notable dental hx.    Pulmonary neg pulmonary ROS,    Pulmonary exam normal        Cardiovascular negative cardio ROS Normal cardiovascular exam     Neuro/Psych    GI/Hepatic negative GI ROS, Neg liver ROS,   Endo/Other  Morbid obesity  Renal/GU negative Renal ROS     Musculoskeletal   Abdominal (+) + obese,   Peds  Hematology negative hematology ROS (+)   Anesthesia Other Findings   Reproductive/Obstetrics (+) Pregnancy                             Anesthesia Physical Anesthesia Plan  ASA: III  Anesthesia Plan: Epidural   Post-op Pain Management:    Induction:   Airway Management Planned:   Additional Equipment:   Intra-op Plan:   Post-operative Plan:   Informed Consent: I have reviewed the patients History and Physical, chart, labs and discussed the procedure including the risks, benefits and alternatives for the proposed anesthesia with the patient or authorized representative who has indicated his/her understanding and acceptance.     Plan Discussed with:   Anesthesia Plan Comments:         Anesthesia Quick Evaluation  

## 2015-12-10 LAB — CBC
HEMATOCRIT: 29 % — AB (ref 36.0–46.0)
HEMOGLOBIN: 10 g/dL — AB (ref 12.0–15.0)
MCH: 29.4 pg (ref 26.0–34.0)
MCHC: 34.5 g/dL (ref 30.0–36.0)
MCV: 85.3 fL (ref 78.0–100.0)
Platelets: 168 10*3/uL (ref 150–400)
RBC: 3.4 MIL/uL — AB (ref 3.87–5.11)
RDW: 14.7 % (ref 11.5–15.5)
WBC: 21.3 10*3/uL — AB (ref 4.0–10.5)

## 2015-12-10 NOTE — Anesthesia Postprocedure Evaluation (Signed)
Anesthesia Post Note  Patient: Victoria [name]  Procedure(s) Performed: * No procedures listed *  Patient location during evaluation: Mother Baby Anesthesia Type: Epidural Level of consciousness: awake Pain management: pain level controlled Vital Signs Assessment: post-procedure vital signs reviewed and stable Respiratory status: spontaneous breathing Cardiovascular status: stable Postop Assessment: no headache, no backache, epidural receding and patient able to bend at knees Anesthetic complications: no     Last Vitals:  Vitals:   12/10/15 0350 12/10/15 0632  BP: (!) 103/57 130/70  Pulse: 77 67  Resp: 20 16  Temp: 36.6 C 36.3 C    Last Pain:  Vitals:   12/10/15 0632  TempSrc: Oral  PainSc:    Pain Goal:                 Victoria [name],Victoria [name]

## 2015-12-10 NOTE — Lactation Note (Signed)
This note was copied from a baby's chart. Lactation Consultation Note  Patient Name: Boy Clance BollMegan Yablonski WGNFA'OToday's Date: 12/10/2015 Reason for consult: Follow-up assessment Baby at 31 hr of life. Mom stated bf has been going well. She denies breast or nipple pain, voiced no concerns. Discussed baby behavior, feeding frequency, voids, wt loss, breast changes, and nipple care. Mom is aware of lactation services and support group. She will call as needed.    Maternal Data    Feeding Feeding Type: Breast Fed Length of feed: 5 min  LATCH Score/Interventions                      Lactation Tools Discussed/Used     Consult Status Consult Status: Follow-up Date: 12/11/15 Follow-up type: In-patient    Rulon Eisenmengerlizabeth E Efton Thomley 12/10/2015, 8:53 PM

## 2015-12-11 MED ORDER — IBUPROFEN 600 MG PO TABS: 600.0000 mg | ORAL_TABLET | Freq: Four times a day (QID) | ORAL | 0 refills | Status: DC

## 2015-12-11 NOTE — Lactation Note (Signed)
This note was copied from a baby's chart. Lactation Consultation Note: Mom reports baby has been nursing well. Has not fed in 3 hours so suggested waking him and attempt to latch. Just as we were getting started RN in to take baby for circ. Reviewed normal behavior after circ. No questions at present. Encouraged to call for assist prn.   Patient Name: Victoria Clance BollMegan [name] ZOXWR'UToday's Date: 12/11/2015 Reason for consult: Follow-up assessment   Maternal Data Formula Feeding for Exclusion: No Has patient been taught Hand Expression?: Yes Does the patient have breastfeeding experience prior to this delivery?: No  Feeding Feeding Type: Breast Fed Length of feed: 25 min  LATCH Score/Interventions                      Lactation Tools Discussed/Used     Consult Status Consult Status: Follow-up Date: 12/11/15 Follow-up type: In-patient    Pamelia HoitWeeks, Harry Bark D 12/11/2015, 8:26 AM

## 2015-12-11 NOTE — Discharge Summary (Signed)
Obstetric Discharge Summary Reason for Admission: onset of labor Prenatal Procedures: ultrasound Intrapartum Procedures: spontaneous vaginal delivery Postpartum Procedures: none Complications-Operative and Postpartum: none Hemoglobin  Date Value Ref Range Status  12/10/2015 10.0 (L) 12.0 - 15.0 g/dL Final    Comment:    DELTA CHECK NOTED REPEATED TO VERIFY    HCT  Date Value Ref Range Status  12/10/2015 29.0 (L) 36.0 - 46.0 % Final    Physical Exam:  General: alert and cooperative Lochia: appropriate Uterine Fundus: firm Incision: na/ DVT Evaluation: No evidence of DVT seen on physical exam.  Discharge Diagnoses: Term Pregnancy-delivered  Discharge Information: Date: 12/11/2015 Activity: pelvic rest Diet: routine Medications: PNV and Ibuprofen Condition: stable Instructions: refer to practice specific booklet Discharge to: home   Newborn Data: Live born female  Birth Weight: 8 lb 2.2 oz (3691 g) APGAR: 8, 9  Home with mother.  Victoria [name] 12/11/2015, 7:47 AM

## 2015-12-26 ENCOUNTER — Encounter: Payer: Self-pay | Admitting: Physician Assistant

## 2016-05-18 NOTE — Progress Notes (Signed)
This encounter was created in error - please disregard.

## 2016-07-28 ENCOUNTER — Encounter (HOSPITAL_COMMUNITY): Payer: Self-pay

## 2021-08-18 ENCOUNTER — Encounter (HOSPITAL_BASED_OUTPATIENT_CLINIC_OR_DEPARTMENT_OTHER): Payer: Self-pay

## 2021-08-18 ENCOUNTER — Emergency Department (HOSPITAL_BASED_OUTPATIENT_CLINIC_OR_DEPARTMENT_OTHER): Payer: No Typology Code available for payment source | Admitting: Radiology

## 2021-08-18 ENCOUNTER — Emergency Department (HOSPITAL_BASED_OUTPATIENT_CLINIC_OR_DEPARTMENT_OTHER)
Admission: EM | Admit: 2021-08-18 | Discharge: 2021-08-18 | Disposition: A | Discharge status: Home / Self Care | Payer: No Typology Code available for payment source | Attending: Emergency Medicine | Admitting: Emergency Medicine

## 2021-08-18 ENCOUNTER — Other Ambulatory Visit: Payer: Self-pay

## 2021-08-18 DIAGNOSIS — S299XXA Unspecified injury of thorax, initial encounter: Secondary | ICD-10-CM | POA: Diagnosis present

## 2021-08-18 DIAGNOSIS — S20219A Contusion of unspecified front wall of thorax, initial encounter: Secondary | ICD-10-CM | POA: Insufficient documentation

## 2021-08-18 DIAGNOSIS — Y92410 Unspecified street and highway as the place of occurrence of the external cause: Secondary | ICD-10-CM | POA: Diagnosis not present

## 2021-08-18 DIAGNOSIS — R051 Acute cough: Secondary | ICD-10-CM | POA: Insufficient documentation

## 2021-08-18 NOTE — Discharge Instructions (Signed)
Return if any problems.

## 2021-08-18 NOTE — ED Notes (Signed)
Discharge instructions discussed with pt. Pt verbalized understanding with no questions at this time. Pt to go home with s/o.   At time of discharge pt temp 100.8 PA Leslie at bedside. Recommend OTC medications.
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
## 2021-08-22 ENCOUNTER — Inpatient Hospital Stay (HOSPITAL_COMMUNITY)
Admission: EM | Admit: 2021-08-22 | Discharge: 2021-08-24 | DRG: 193 | Disposition: A | Discharge status: Home / Self Care | Payer: 59 | Attending: Internal Medicine | Admitting: Internal Medicine

## 2021-08-22 ENCOUNTER — Encounter (HOSPITAL_COMMUNITY): Payer: Self-pay

## 2021-08-22 ENCOUNTER — Other Ambulatory Visit: Payer: Self-pay

## 2021-08-22 ENCOUNTER — Emergency Department (HOSPITAL_COMMUNITY): Payer: 59

## 2021-08-22 DIAGNOSIS — Z803 Family history of malignant neoplasm of breast: Secondary | ICD-10-CM

## 2021-08-22 DIAGNOSIS — Z8616 Personal history of COVID-19: Secondary | ICD-10-CM

## 2021-08-22 DIAGNOSIS — Z8249 Family history of ischemic heart disease and other diseases of the circulatory system: Secondary | ICD-10-CM

## 2021-08-22 DIAGNOSIS — E876 Hypokalemia: Secondary | ICD-10-CM | POA: Diagnosis present

## 2021-08-22 DIAGNOSIS — D649 Anemia, unspecified: Secondary | ICD-10-CM | POA: Diagnosis present

## 2021-08-22 DIAGNOSIS — F909 Attention-deficit hyperactivity disorder, unspecified type: Secondary | ICD-10-CM | POA: Diagnosis present

## 2021-08-22 DIAGNOSIS — J9601 Acute respiratory failure with hypoxia: Secondary | ICD-10-CM | POA: Diagnosis present

## 2021-08-22 DIAGNOSIS — E669 Obesity, unspecified: Secondary | ICD-10-CM | POA: Diagnosis present

## 2021-08-22 DIAGNOSIS — Z975 Presence of (intrauterine) contraceptive device: Secondary | ICD-10-CM

## 2021-08-22 DIAGNOSIS — Z91018 Allergy to other foods: Secondary | ICD-10-CM

## 2021-08-22 DIAGNOSIS — J189 Pneumonia, unspecified organism: Principal | ICD-10-CM | POA: Diagnosis present

## 2021-08-22 DIAGNOSIS — E8809 Other disorders of plasma-protein metabolism, not elsewhere classified: Secondary | ICD-10-CM | POA: Diagnosis present

## 2021-08-22 DIAGNOSIS — Z20822 Contact with and (suspected) exposure to covid-19: Secondary | ICD-10-CM | POA: Diagnosis present

## 2021-08-22 DIAGNOSIS — I272 Pulmonary hypertension, unspecified: Secondary | ICD-10-CM | POA: Diagnosis present

## 2021-08-22 LAB — URINALYSIS, ROUTINE W REFLEX MICROSCOPIC
Glucose, UA: NEGATIVE mg/dL
Ketones, ur: NEGATIVE mg/dL
Leukocytes,Ua: NEGATIVE
Nitrite: NEGATIVE
Protein, ur: 100 mg/dL — AB
RBC / HPF: >50 RBC/hpf — ABNORMAL HIGH (ref 0–5)
Specific Gravity, Urine: 1.033 — ABNORMAL HIGH (ref 1.005–1.030)
pH: 5 (ref 5.0–8.0)

## 2021-08-22 LAB — RESPIRATORY PANEL BY PCR

## 2021-08-22 LAB — CBC WITH DIFFERENTIAL/PLATELET
Abs Immature Granulocytes: 0.05 10*3/uL (ref 0.00–0.07)
Basophils Absolute: 0 10*3/uL (ref 0.0–0.1)
Basophils Relative: 0 %
Eosinophils Absolute: 0 10*3/uL (ref 0.0–0.5)
Eosinophils Relative: 0 %
HCT: 37.8 % (ref 36.0–46.0)
Hemoglobin: 12.9 g/dL (ref 12.0–15.0)
Immature Granulocytes: 1 %
Lymphocytes Relative: 14 %
Lymphs Abs: 1.4 10*3/uL (ref 0.7–4.0)
MCH: 28.8 pg (ref 26.0–34.0)
MCHC: 34.1 g/dL (ref 30.0–36.0)
MCV: 84.4 fL (ref 80.0–100.0)
Monocytes Absolute: 0.8 10*3/uL (ref 0.1–1.0)
Monocytes Relative: 8 %
Neutro Abs: 7.6 10*3/uL (ref 1.7–7.7)
Neutrophils Relative %: 77 %
Platelets: 240 10*3/uL (ref 150–400)
RBC: 4.48 MIL/uL (ref 3.87–5.11)
RDW: 13 % (ref 11.5–15.5)
WBC: 9.9 10*3/uL (ref 4.0–10.5)
nRBC: 0 % (ref 0.0–0.2)

## 2021-08-22 LAB — COMPREHENSIVE METABOLIC PANEL
ALT: 29 U/L (ref 0–44)
AST: 21 U/L (ref 15–41)
Albumin: 3.3 g/dL — ABNORMAL LOW (ref 3.5–5.0)
Alkaline Phosphatase: 107 U/L (ref 38–126)
Anion gap: 12 (ref 5–15)
BUN: 7 mg/dL (ref 6–20)
CO2: 23 mmol/L (ref 22–32)
Calcium: 8.7 mg/dL — ABNORMAL LOW (ref 8.9–10.3)
Chloride: 105 mmol/L (ref 98–111)
Creatinine, Ser: 0.99 mg/dL (ref 0.44–1.00)
GFR, Estimated: >60 mL/min (ref >60)
Glucose, Bld: 121 mg/dL — ABNORMAL HIGH (ref 70–99)
Potassium: 3.9 mmol/L (ref 3.5–5.1)
Sodium: 140 mmol/L (ref 135–145)
Total Bilirubin: 1 mg/dL (ref 0.3–1.2)
Total Protein: 7 g/dL (ref 6.5–8.1)

## 2021-08-22 LAB — SARS CORONAVIRUS 2 BY RT PCR: SARS Coronavirus 2 by RT PCR: NEGATIVE

## 2021-08-22 LAB — HCG, QUANTITATIVE, PREGNANCY: hCG, Beta Chain, Quant, S: <1 m[IU]/mL (ref <5)

## 2021-08-22 LAB — LACTIC ACID, PLASMA: Lactic Acid, Venous: 1.5 mmol/L (ref 0.5–1.9)

## 2021-08-22 MED ORDER — ACETAMINOPHEN 650 MG RE SUPP: 650.0000 mg | Freq: Four times a day (QID) | RECTAL | Status: DC | PRN

## 2021-08-22 MED ORDER — ONDANSETRON HCL 4 MG PO TABS: 4.0000 mg | ORAL_TABLET | Freq: Four times a day (QID) | ORAL | Status: DC | PRN

## 2021-08-22 MED ORDER — NAPROXEN 250 MG PO TABS
500.0000 mg | ORAL_TABLET | Freq: Once | ORAL | Status: AC
Start: 2021-08-22 — End: 2021-08-22
  Administered 2021-08-22: 500 mg via ORAL
  Filled 2021-08-22: qty 2

## 2021-08-22 MED ORDER — KETOROLAC TROMETHAMINE 15 MG/ML IJ SOLN: 15.0000 mg | Freq: Once | INTRAMUSCULAR | Status: DC

## 2021-08-22 MED ORDER — AZITHROMYCIN 250 MG PO TABS: 500.0000 mg | ORAL_TABLET | Freq: Every day | ORAL | Status: DC

## 2021-08-22 MED ORDER — SODIUM CHLORIDE 0.9 % IV SOLN: 2.0000 g | INTRAVENOUS | Status: DC

## 2021-08-22 MED ORDER — AZITHROMYCIN 250 MG PO TABS
500.0000 mg | ORAL_TABLET | Freq: Every day | ORAL | Status: DC
  Administered 2021-08-23: 500 mg via ORAL
  Filled 2021-08-22: qty 2

## 2021-08-22 MED ORDER — ONDANSETRON HCL 4 MG/2ML IJ SOLN: 4.0000 mg | Freq: Four times a day (QID) | INTRAMUSCULAR | Status: DC | PRN

## 2021-08-22 MED ORDER — SODIUM CHLORIDE 0.9 % IV SOLN
1.0000 g | Freq: Once | INTRAVENOUS | Status: AC
Start: 2021-08-22 — End: 2021-08-22
  Administered 2021-08-22: 1 g via INTRAVENOUS
  Filled 2021-08-22: qty 10

## 2021-08-22 MED ORDER — ACETAMINOPHEN 325 MG PO TABS: 650.0000 mg | ORAL_TABLET | Freq: Four times a day (QID) | ORAL | Status: DC | PRN

## 2021-08-22 MED ORDER — SODIUM CHLORIDE 0.9 % IV BOLUS
1000.0000 mL | Freq: Once | INTRAVENOUS | Status: AC
  Administered 2021-08-22: 1000 mL via INTRAVENOUS

## 2021-08-22 MED ORDER — MORPHINE SULFATE (PF) 4 MG/ML IV SOLN
4.0000 mg | Freq: Once | INTRAVENOUS | Status: AC
  Administered 2021-08-22: 4 mg via INTRAVENOUS
  Filled 2021-08-22: qty 1

## 2021-08-22 MED ORDER — SODIUM CHLORIDE 0.9 % IV SOLN
1.0000 g | Freq: Once | INTRAVENOUS | Status: AC
  Administered 2021-08-22: 1 g via INTRAVENOUS
  Filled 2021-08-22: qty 10

## 2021-08-22 MED ORDER — SENNOSIDES-DOCUSATE SODIUM 8.6-50 MG PO TABS: 1.0000 | ORAL_TABLET | Freq: Every evening | ORAL | Status: DC | PRN

## 2021-08-22 MED ORDER — ACETAMINOPHEN 325 MG PO TABS
650.0000 mg | ORAL_TABLET | Freq: Once | ORAL | Status: AC | PRN
  Administered 2021-08-22: 650 mg via ORAL
  Filled 2021-08-22: qty 2

## 2021-08-22 MED ORDER — AZITHROMYCIN 250 MG PO TABS
500.0000 mg | ORAL_TABLET | Freq: Once | ORAL | Status: AC
  Administered 2021-08-22: 500 mg via ORAL
  Filled 2021-08-22: qty 2

## 2021-08-22 MED ORDER — RIVAROXABAN 10 MG PO TABS
10.0000 mg | ORAL_TABLET | Freq: Every day | ORAL | Status: DC
  Administered 2021-08-22 – 2021-08-24 (×3): 10 mg via ORAL
  Filled 2021-08-22 (×3): qty 1
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
## 2021-08-22 NOTE — H&P (Signed)
Date: 08/22/2021               Patient Name:  Victoria [name] MRN: [id number]  DOB: 10/23/1990 Age / Sex: 31 y.o., female   PCP: Pcp, No         Medical Service: Internal Medicine Teaching Service         Attending Physician: Dr. Reymundo Poll, MD    First Contact: Dr. Rudene Christians Pager: [phone]  Second Contact: Dr. Evlyn Kanner Pager: [phone]       After Hours (After 5p/  First Contact Pager: [phone]  weekends / holidays): Second Contact Pager: [phone]   Chief Complaint: Shortness of breath  History of Present Illness: Victoria [name] is a 31 yo female with no significant PMH who presents to [facility] with shortness of breath, cough, and a fever.   The patient states that she first started coughing and experiencing shortness of breath about 5 days ago. Her cough has been dry, although today she states that today it became productive. The patient states that her shortness of breath is most noticeable on exertion, although she does get short of breath during her coughing episodes. Her shortness of breath improves at rest. She went to the [facility] ED on 6/14, as her symptoms were progressively worsening. She had a CXR at that time and was told it was normal, but that she had poor air quality. Her symptoms have continued over this time period and she has now been experiencing fevers and chills, with T max 104.7 at home in addition to her cough and shortness of breath. She has been taking ibuprofen at home for her fevers. The patient also endorses nausea and vomiting over the past couple of days and states she has not been able to keep much food down. She reports losing approximately 10 lbs since her symptoms started. She has also had a headache and some chest pain associated with her coughing episodes, but states that the pain improves with rest. The patient denies any diarrhea, constipation, melena, hematochezia, or dysuria. She does endorse an increase in urinary frequency  for the past 1-2 days, although she attributes this to drinking more water. She denies any hematuria, but states that she has an IUD and has occasional spotting, most recently noticing some spotting yesterday and today. The patient also states that she had pneumonia 6 years ago that improved with antibiotics and did not require admission at that time. She has previously been diagnosed with COVID twice, with the last time being in December of 2022. The patient had two doses of the COVID vaccine and did not receive any boosters.     Meds:  None No outpatient medications have been marked as taking for the 08/22/21 encounter [facility] Encounter).    Allergies: Allergies as of 08/22/2021   (No Known Allergies)   Past Medical History:  Diagnosis Date   ADHD (attention deficit hyperactivity disorder)    Migraines    Ovarian cyst    on right side    Family History: Cardiovascular disease Cervical and breast cancer on maternal side of family   Social History: Lives with spouse, 35 year old child, dog, and cat in [place]. Works as a Hospital doctor with [employer] and her spouse works as a Naval architect (although he has been out of work for the past few months due to a shoulder surgery). Does not have a primary care doctor but is interested in establishing care with the [facility].  No tobacco use. Drinks <1 alcoholic beverage in a week. Denies any other drug use.  Review of Systems: A complete ROS was negative except as per HPI.   Physical Exam: Blood pressure 122/79, pulse (!) 109, temperature (!) 100.4 F (38 C), temperature source Oral, resp. rate (!) 29, SpO2 92 %, unknown if currently breastfeeding.  General: Pleasant, well-appearing obese female laying in bed. No acute distress. Head: Normocephalic. Atraumatic. CV: RRR. No murmurs, rubs, or gallops. No LE edema Pulmonary: Normal work of breathing on room air. Some crackles appreciated in lower lung fields. No wheezing appreciated.   Abdominal: Soft, nontender, nondistended. Normal bowel sounds. Extremities: Palpable radial and DP pulses. Normal ROM. Skin: Warm and dry. No obvious rash or lesions. Neuro: A&Ox3. Moves all extremities. Normal sensation. No focal deficit. Psych: Normal mood and affect   EKG: personally reviewed my interpretation is sinus tachycardia. S1Q3T3 appreciated, although this is unchanged compared to EKG from 6/14.   CXR: personally reviewed my interpretation is bilateral opacities, new compared to CXR from 6/14.   CBC    Component Value Date/Time   WBC 9.9 08/22/2021 1350   RBC 4.48 08/22/2021 1350   HGB 12.9 08/22/2021 1350   HCT 37.8 08/22/2021 1350   PLT 240 08/22/2021 1350   MCV 84.4 08/22/2021 1350   MCH 28.8 08/22/2021 1350   MCHC 34.1 08/22/2021 1350   RDW 13.0 08/22/2021 1350   LYMPHSABS 1.4 08/22/2021 1350   MONOABS 0.8 08/22/2021 1350   EOSABS 0.0 08/22/2021 1350   BASOSABS 0.0 08/22/2021 1350      Latest Ref Rng & Units 08/22/2021    1:50 PM 03/06/2015    4:23 AM 11/01/2011    8:43 AM  BMP  Glucose 70 - 99 mg/dL 683  419  86   BUN 6 - 20 mg/dL 7  8  10    Creatinine 0.44 - 1.00 mg/dL  6.22  2.97   Sodium 135 - 145 mmol/L 140  138  138   Potassium 3.5 - 5.1 mmol/L 3.9  4.0  4.4   Chloride 98 - 111 mmol/L 105  108  106   CO2 22 - 32 mmol/L 23  21  26    Calcium 8.9 - 10.3 mg/dL 8.7  9.0  9.4      Assessment & Plan by Problem: Principal Problem:   CAP (community acquired pneumonia)  Victoria [name] is a 31 yo female with no significant PMH who presented with SOB, cough, and fever and admitted for CAP.   #Community Acquired Pneumonia Patient presented with 5 days of cough, shortness of breath, fever (T max 104 at home), and chills. She presented to the ED on 6/14 and CXR was unremarkable at this time. Her symptoms have continued to worsen, thus prompting her to return to the ED. She has been febrile to 103.60F, tachycardic up in to the 110s, and tachypneic in  the 20-30s. O2 saturation has been stable on room air, however, the patient did desaturate to the mid 80s while ambulating. CXR significant for bilateral lung opacities in the lower lung fields. CBC with no leukocytosis. PE was on the differential, however, Well's score is 1.5, putting the patient at low risk of pulmonary embolism. Additionally, she has no history of clotting disorders, has not been immobile recently, nor has she undergone any surgeries. The patient received one dose of azithromycin and ceftriaxone in the ED for CAP coverage. - Continue azithromycin 500 mg daily x 3 days, ceftriaxone 2  g x 5 days total  - Daily CBC and BMP - Respiratory panel and COVID panel pending - Maintain SpO2 >90% - Check ambulatory pulse ox in the morning  Best practices: Code: Full Diet: Regular Fluids: None VTE: Xarelto  Dispo: Admit patient to Observation with expected length of stay less than 2 midnights.  SignedChauncey Mann, DO 08/22/2021, 8:46 PM  After 5pm on weekdays and 1pm on weekends: On Call pager: [phone]

## 2021-08-22 NOTE — ED Notes (Signed)
Pt ambulated in the hallway O2 sat dropped to 87% on room air.
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
## 2021-08-22 NOTE — ED Notes (Signed)
Pt pretty winded before and after using the restroom via wheelchair.  02 sats 91-92%.  Pt placed on 2L [place] for support.  Pt is tachypneic in the 30's
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
## 2021-08-23 ENCOUNTER — Observation Stay (HOSPITAL_COMMUNITY): Payer: 59

## 2021-08-23 ENCOUNTER — Inpatient Hospital Stay (HOSPITAL_COMMUNITY): Payer: 59

## 2021-08-23 DIAGNOSIS — D649 Anemia, unspecified: Secondary | ICD-10-CM | POA: Diagnosis present

## 2021-08-23 DIAGNOSIS — Z20822 Contact with and (suspected) exposure to covid-19: Secondary | ICD-10-CM | POA: Diagnosis present

## 2021-08-23 DIAGNOSIS — E669 Obesity, unspecified: Secondary | ICD-10-CM | POA: Diagnosis present

## 2021-08-23 DIAGNOSIS — J189 Pneumonia, unspecified organism: Secondary | ICD-10-CM | POA: Diagnosis present

## 2021-08-23 DIAGNOSIS — I272 Pulmonary hypertension, unspecified: Secondary | ICD-10-CM | POA: Diagnosis present

## 2021-08-23 DIAGNOSIS — Z803 Family history of malignant neoplasm of breast: Secondary | ICD-10-CM | POA: Diagnosis not present

## 2021-08-23 DIAGNOSIS — Z8616 Personal history of COVID-19: Secondary | ICD-10-CM | POA: Diagnosis not present

## 2021-08-23 DIAGNOSIS — R9431 Abnormal electrocardiogram [ECG] [EKG]: Secondary | ICD-10-CM | POA: Diagnosis not present

## 2021-08-23 DIAGNOSIS — Z91018 Allergy to other foods: Secondary | ICD-10-CM | POA: Diagnosis not present

## 2021-08-23 DIAGNOSIS — Z8249 Family history of ischemic heart disease and other diseases of the circulatory system: Secondary | ICD-10-CM | POA: Diagnosis not present

## 2021-08-23 DIAGNOSIS — E8809 Other disorders of plasma-protein metabolism, not elsewhere classified: Secondary | ICD-10-CM | POA: Diagnosis present

## 2021-08-23 DIAGNOSIS — F909 Attention-deficit hyperactivity disorder, unspecified type: Secondary | ICD-10-CM | POA: Diagnosis present

## 2021-08-23 DIAGNOSIS — J9601 Acute respiratory failure with hypoxia: Secondary | ICD-10-CM | POA: Diagnosis present

## 2021-08-23 DIAGNOSIS — E876 Hypokalemia: Secondary | ICD-10-CM | POA: Diagnosis present

## 2021-08-23 DIAGNOSIS — Z975 Presence of (intrauterine) contraceptive device: Secondary | ICD-10-CM | POA: Diagnosis not present

## 2021-08-23 LAB — BASIC METABOLIC PANEL
Anion gap: 9 (ref 5–15)
BUN: 10 mg/dL (ref 6–20)
CO2: 23 mmol/L (ref 22–32)
Calcium: 8.4 mg/dL — ABNORMAL LOW (ref 8.9–10.3)
Chloride: 109 mmol/L (ref 98–111)
Creatinine, Ser: 0.91 mg/dL (ref 0.44–1.00)
GFR, Estimated: >60 mL/min (ref >60)
Glucose, Bld: 105 mg/dL — ABNORMAL HIGH (ref 70–99)
Potassium: 3.2 mmol/L — ABNORMAL LOW (ref 3.5–5.1)
Sodium: 141 mmol/L (ref 135–145)

## 2021-08-23 LAB — ECHOCARDIOGRAM COMPLETE
AR max vel: 4.31 cm2
AV Area VTI: 4.06 cm2
AV Area mean vel: 3.95 cm2
AV Mean grad: 2 mmHg
AV Peak grad: 4.5 mmHg
Ao pk vel: 1.06 m/s
MV VTI: 3.42 cm2
S' Lateral: 3.2 cm

## 2021-08-23 LAB — CBC
HCT: 32.7 % — ABNORMAL LOW (ref 36.0–46.0)
Hemoglobin: 11.4 g/dL — ABNORMAL LOW (ref 12.0–15.0)
MCH: 28.9 pg (ref 26.0–34.0)
MCHC: 34.9 g/dL (ref 30.0–36.0)
MCV: 83 fL (ref 80.0–100.0)
Platelets: 231 10*3/uL (ref 150–400)
RBC: 3.94 MIL/uL (ref 3.87–5.11)
RDW: 13.2 % (ref 11.5–15.5)
WBC: 8.1 10*3/uL (ref 4.0–10.5)
nRBC: 0 % (ref 0.0–0.2)

## 2021-08-23 LAB — HIV ANTIBODY (ROUTINE TESTING W REFLEX): HIV Screen 4th Generation wRfx: NONREACTIVE

## 2021-08-23 LAB — MAGNESIUM: Magnesium: 2 mg/dL (ref 1.7–2.4)

## 2021-08-23 MED ORDER — GUAIFENESIN-DM 100-10 MG/5ML PO SYRP
5.0000 mL | ORAL_SOLUTION | ORAL | Status: DC | PRN
  Administered 2021-08-23 (×2): 5 mL via ORAL
  Filled 2021-08-23 (×2): qty 5

## 2021-08-23 MED ORDER — AZITHROMYCIN 250 MG PO TABS: 500.0000 mg | ORAL_TABLET | Freq: Every day | ORAL | Status: DC

## 2021-08-23 MED ORDER — AZITHROMYCIN 250 MG PO TABS
500.0000 mg | ORAL_TABLET | Freq: Every day | ORAL | Status: AC
  Administered 2021-08-24: 500 mg via ORAL
  Filled 2021-08-23: qty 2

## 2021-08-23 MED ORDER — AMOXICILLIN-POT CLAVULANATE 875-125 MG PO TABS
1.0000 | ORAL_TABLET | Freq: Two times a day (BID) | ORAL | Status: DC
  Administered 2021-08-23 – 2021-08-24 (×3): 1 via ORAL
  Filled 2021-08-23 (×3): qty 1

## 2021-08-23 MED ORDER — POTASSIUM CHLORIDE 20 MEQ PO PACK
40.0000 meq | PACK | Freq: Once | ORAL | Status: AC
  Administered 2021-08-23: 40 meq via ORAL
  Filled 2021-08-23: qty 2

## 2021-08-23 NOTE — ED Notes (Signed)
Pt's belongings placed in belongings bag.

## 2021-08-23 NOTE — Hospital Course (Addendum)
Community Acquired Pneumonia Acute hypoxic respiratory failure-resolved Patient present with shortness of breath and hypoxia with ambulation. CXR showed right middle lobe consolidation. Respiratory panel was negative and COVID negative. She received IV antibiotics and then transitioned to PO antibiotics with Augmentin and Azithromycin.   Possible right heart strain EKG showed S1Q3T3 pattern concerning for right heart strain. This has been seen on prior EKGs. 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 Echo showed   Normocytic anemia Hgb at 11.4 from 12.9 this AM. Hct also downtrended. I think this is likely dilutional with fluids given in ED.  Hypokalemia Potassium at 3.2.   Hypoalbuminemia Albumin at 3.3 on admission. Calcium corrected is within normal limits. Likely related to inflammation from acute illness  Ms. Dareen Piano,  You were recently admitted to [facility] for pneumonia. It will take your body some time to recover from this infection.   Continue taking your home medications with the following changes  Start taking       -Augmentin, 2 times a day last dose on 6/22  You should seek further medical care if breathing worsens.  Follow-up with [facility] on 6/29 at 1:15 with Dr. Welton Flakes.  Sincerely, Rudene Christians, DO

## 2021-08-23 NOTE — ED Notes (Signed)
Lunch order placed

## 2021-08-23 NOTE — Evaluation (Signed)
Physical Therapy Evaluation Patient Details Name: Victoria [name] MRN: [id number] DOB: June 30, 1990 Today's Date: 08/23/2021  History of Present Illness  Pt is a 31 y/o female admitted secondary to cough, fever, and SOB. Found to have PNA. No pertinent PMH.  Clinical Impression  Pt admitted secondary to problem above with deficits below. Pt requiring min guard to supervision for safety for mobility in the room. Poor tolerance secondary to SOB and required seated rest in between ambulation trials in the room. Oxygen sats at 90% on RA. Educated about incentive spirometer use, walking program and energy conservation techniques for home. Will continue to follow acutely.        Recommendations for follow up therapy are one component of a multi-disciplinary discharge planning process, led by the attending physician.  Recommendations may be updated based on patient status, additional functional criteria and insurance authorization.  Follow Up Recommendations No PT follow up    Assistance Recommended at Discharge Intermittent Supervision/Assistance  Patient can return home with the following  Assistance with cooking/housework    Equipment Recommendations None recommended by PT  Recommendations for Other Services       Functional Status Assessment Patient has had a recent decline in their functional status and demonstrates the ability to make significant improvements in function in a reasonable and predictable amount of time.     Precautions / Restrictions Precautions Precautions: Other (comment) Precaution Comments: watch O2 Restrictions Weight Bearing Restrictions: No      Mobility  Bed Mobility               General bed mobility comments: Sitting EOB upon entry    Transfers Overall transfer level: Needs assistance Equipment used: None Transfers: Sit to/from Stand Sit to Stand: Supervision           General transfer comment: supervision for safety.     Ambulation/Gait Ambulation/Gait assistance: Supervision, Min guard Gait Distance (Feet): 15 Feet (8, 8, 15) Assistive device: None Gait Pattern/deviations: Step-through pattern, Decreased stride length Gait velocity: decreased     General Gait Details: Min guard to supervision for safety. Only able to tolerate very short distances secondary to worsening SOB. oxygen sats at 90% on RA. Required seated rest in between and cues for pursed lip breathing.  Stairs            Wheelchair Mobility    Modified Rankin (Stroke Patients Only)       Balance Overall balance assessment: Mild deficits observed, not formally tested                                           Pertinent Vitals/Pain Pain Assessment Pain Assessment: No/denies pain    Home Living Family/patient expects to be discharged to:: Private residence Living Arrangements: Spouse/significant other Available Help at Discharge: Family;Available 24 hours/day Type of Home: Mobile home Home Access: Stairs to enter Entrance Stairs-Rails: Left;Right;Can reach both Entrance Stairs-Number of Steps: 3   Home Layout: One level Home Equipment: Shower seat;Crutches      Prior Function Prior Level of Function : Independent/Modified Independent;Working/employed;Driving                     Hand Dominance        Extremity/Trunk Assessment   Upper Extremity Assessment Upper Extremity Assessment: Overall WFL for tasks assessed    Lower Extremity Assessment Lower Extremity Assessment: Overall [facility]  for tasks assessed    Cervical / Trunk Assessment Cervical / Trunk Assessment: Normal  Communication   Communication: No difficulties  Cognition Arousal/Alertness: Awake/alert Behavior During Therapy: WFL for tasks assessed/performed Overall Cognitive Status: Within Functional Limits for tasks assessed                                          General Comments General comments  (skin integrity, edema, etc.): Educated about energy conservation techniques for home    Exercises     Assessment/Plan    PT Assessment Patient needs continued PT services  PT Problem List Cardiopulmonary status limiting activity;Decreased activity tolerance;Decreased mobility       PT Treatment Interventions Gait training;Functional mobility training;Stair training;Therapeutic activities;Therapeutic exercise;Balance training;Patient/family education    PT Goals (Current goals can be found in the Care Plan section)  Acute Rehab PT Goals Patient Stated Goal: to feel better PT Goal Formulation: With patient Time For Goal Achievement: 09/06/21 Potential to Achieve Goals: Good    Frequency Min 3X/week     Co-evaluation               AM-PAC PT "6 Clicks" Mobility  Outcome Measure Help needed turning from your back to your side while in a flat bed without using bedrails?: None Help needed moving from lying on your back to sitting on the side of a flat bed without using bedrails?: None Help needed moving to and from a bed to a chair (including a wheelchair)?: A Little Help needed standing up from a chair using your arms (e.g., wheelchair or bedside chair)?: A Little Help needed to walk in hospital room?: A Little Help needed climbing 3-5 steps with a railing? : A Little 6 Click Score: 20    End of Session   Activity Tolerance: Treatment limited secondary to medical complications (Comment) (increased SOB) Patient left: in bed;with call bell/phone within reach (on stretcher in ED) Nurse Communication: Mobility status PT Visit Diagnosis: Other abnormalities of gait and mobility (R26.89)    Time: 4332-9518 PT Time Calculation (min) (ACUTE ONLY): 19 min   Charges:   PT Evaluation $PT Eval Low Complexity: 1 Low          Cindee Salt, DPT  Acute Rehabilitation Services  Office: [phone]   Lehman Prom 08/23/2021, 12:00 PM

## 2021-08-23 NOTE — ED Notes (Signed)
Pillow provided.

## 2021-08-23 NOTE — ED Notes (Signed)
Per PT, Pt's oxygen dropped to 90% RA w/ ambulation, but Pt became very tachypneic and could not tolerate significant ambulation.

## 2021-08-23 NOTE — Progress Notes (Signed)
SATURATION QUALIFICATIONS: (This note is used to comply with regulatory documentation for home oxygen)  Patient Saturations on Room Air at Rest = 94%  Patient Saturations on Room Air while Ambulating = 90%   Please briefly explain why patient needs home oxygen: Pt did not require supplemental oxygen to maintain sats, however, pt very dyspneic with ambulation. DOE at 3/4.   Farley Ly, PT, DPT  Acute Rehabilitation Services  Office: [phone]

## 2021-08-23 NOTE — Progress Notes (Signed)
HD#0 Subjective:  Overnight Events: oxygen desaturation to 83%   Patient assessed at bedside this AM. She states that she continues to cough frequently. This is worsened when she gets up to move.   Pt is updated on the plan for today, and all questions and concerns are addressed.   Objective:  Vital signs in last 24 hours: Vitals:   08/23/21 0830 08/23/21 0900 08/23/21 0922 08/23/21 1000  BP: 135/82 122/82  128/84  Pulse: 92 98  89  Resp: (!) 34 (!) 33  (!) 30  Temp:   98.1 F (36.7 C)   TempSrc:   Oral   SpO2: 94% 93%  92%   Supplemental O2: Nasal Cannula SpO2: 92 % O2 Flow Rate (L/min): 2 L/min   Physical Exam:  Constitutional: well-appearing, sitting up in bed, in no acute distress Cardiovascular: regular rate and rhythm, no m/r/g Pulmonary/Chest: normal work of breathing on nasal cannula, frequent coughing, lungs clear to auscultation bilaterally Abdominal: soft, non-tender, non-distended MSK: no edema in lower extremities Neurological: alert and oriented x3 Skin: warm and dry Psych: normal mood and affect  There were no vitals filed for this visit.   Intake/Output Summary (Last 24 hours) at 08/23/2021 1014 Last data filed at 08/22/2021 2153 Gross per 24 hour  Intake 1200 ml  Output --  Net 1200 ml   Net IO Since Admission: 1,200 mL [08/23/21 1014]  Pertinent Labs:    Latest Ref Rng & Units 08/23/2021    5:44 AM 08/22/2021    1:50 PM 12/10/2015    5:09 AM  CBC  WBC 4.0 - 10.5 K/uL 8.1  9.9  21.3   Hemoglobin 12.0 - 15.0 g/dL 60.6  30.1  60.1   Hematocrit 36.0 - 46.0 % 32.7  37.8  29.0   Platelets 150 - 400 K/uL 231  240  168        Latest Ref Rng & Units 08/23/2021    5:44 AM 08/22/2021    1:50 PM 03/06/2015    4:23 AM  CMP  Glucose 70 - 99 mg/dL 093  235  573   BUN 6 - 20 mg/dL 10  7  8    Creatinine 0.44 - 1.00 mg/dL  2.20  2.54   Sodium 135 - 145 mmol/L 141  140  138   Potassium 3.5 - 5.1 mmol/L 3.2  3.9  4.0   Chloride 98 - 111 mmol/L  109  105  108   CO2 22 - 32 mmol/L 23  23  21    Calcium 8.9 - 10.3 mg/dL 8.4  8.7  9.0   Total Protein 6.5 - 8.1 g/dL  7.0  7.0   Total Bilirubin 0.3 - 1.2 mg/dL  1.0  0.5   Alkaline Phos 38 - 126 U/L  107  104   AST 15 - 41 U/L  21  17   ALT 0 - 44 U/L  29  18     Imaging: DG Chest 2 View  Result Date: 08/22/2021 CLINICAL DATA:  Shortness of breath, cough and congestion. EXAM: CHEST - 2 VIEW COMPARISON:  08/18/2021 FINDINGS: Bilateral mid and lower lung airspace opacities are now noted. There appears to be elevation of the RIGHT hemidiaphragm suggesting RIGHT LOWER lung atelectasis/volume loss. There may be trace bilateral pleural effusions present. There is no evidence of pneumothorax or acute bony abnormality. IMPRESSION: 1. Bilateral mid and lower lung airspace opacities suspicious for pneumonia. 2. Probable RIGHT LOWER lung atelectasis/volume loss. 3.  Question trace effusions. Electronically Signed   By: Harmon Pier M.D.   On: 08/22/2021 14:39    Assessment/Plan:   Principal Problem:   CAP (community acquired pneumonia)   Patient Summary: Victoria [name] is a 31 y.o. with a pertinent no PMH, who presented with shortness of breath, cough, and fever and admitted on 6/18 for community acquired pneumonia on HD#0. She has stable oxygen saturations on room air and was able to work with PT. Will transition from IV abx to PO and have patient complete workup with echo.   Community Acquired Pneumonia Acute hypoxic respiratory failure-resolved Patient present with shortness of breath and hypoxia with ambulation. CXR showed right middle lobe consolidation. Respiratory panel was negative and COVID negative. She was able to work with physical therapy and did not need supplemental oxygen. She did have dyspnea with exertion when working with PT. I will go back to talk with patient about support at home and her comfort level with dyspneic symptoms. -PO Augmentin 875- 125 mg BID, last dosing on  6/22 - Maintain SpO2 >90% - Check ambulatory pulse ox in the morning  Possible right heart strain EKG showed [id number] pattern concerning for right heart strain. This has been seen on prior EKGs. 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 -echo pending  Normocytic anemia Hgb at 11.4 from 12.9 this AM. Hct also downtrended. I think this is likely dilutional with fluids given in ED. -trend CBC  Hypokalemia Potassium at 3.2.  -Check Mg -Potassium 40 mEq once  Hypoalbuminemia Albumin at 3.3 on admission. Calcium corrected is within normal limits. Likely related to inflammation from acute illness -trend CMP  Diet: Normal VTE: NOAC Code: Full PT/OT recs: None.  Dispo: Anticipated discharge to Home this afternoon or 6/20 pending echo and patients comfort with going home.   Victoria [name] M. Victoria [name], D.O.  Internal Medicine Resident, PGY-1 [name]  Pager: [phone] 10:14 AM, 08/23/2021   **Please contact the on call pager after 5 pm and on weekends at [phone].**

## 2021-08-23 NOTE — ED Notes (Signed)
Pt ambulated to restroom on RA. PT returned to room winded and satting 83%.  Placed pt on 4L [place]  for a couple of minutes to catch her up.  Titrated back to 2L for now.  I advised her she should use a bedside commode for now  moving forward and if that gets her winded then maybe use oxygen when moving around.

## 2021-08-23 NOTE — Progress Notes (Signed)
  Echocardiogram 2D Echocardiogram has been performed.  Gerda Diss 08/23/2021, 5:36 PM

## 2021-08-24 DIAGNOSIS — J189 Pneumonia, unspecified organism: Secondary | ICD-10-CM | POA: Diagnosis not present

## 2021-08-24 LAB — IRON AND TIBC
Iron: 66 ug/dL (ref 28–170)
Saturation Ratios: 31 % (ref 10.4–31.8)
TIBC: 211 ug/dL — ABNORMAL LOW (ref 250–450)
UIBC: 145 ug/dL

## 2021-08-24 LAB — BASIC METABOLIC PANEL
Anion gap: 11 (ref 5–15)
BUN: 13 mg/dL (ref 6–20)
CO2: 24 mmol/L (ref 22–32)
Calcium: 9.1 mg/dL (ref 8.9–10.3)
Chloride: 109 mmol/L (ref 98–111)
Creatinine, Ser: 0.91 mg/dL (ref 0.44–1.00)
GFR, Estimated: >60 mL/min (ref >60)
Glucose, Bld: 96 mg/dL (ref 70–99)
Potassium: 3.7 mmol/L (ref 3.5–5.1)
Sodium: 144 mmol/L (ref 135–145)

## 2021-08-24 LAB — FOLATE: Folate: 14.4 ng/mL (ref >5.9)

## 2021-08-24 LAB — CBC
HCT: 33.3 % — ABNORMAL LOW (ref 36.0–46.0)
Hemoglobin: 11.3 g/dL — ABNORMAL LOW (ref 12.0–15.0)
MCH: 28.3 pg (ref 26.0–34.0)
MCHC: 33.9 g/dL (ref 30.0–36.0)
MCV: 83.5 fL (ref 80.0–100.0)
Platelets: 276 10*3/uL (ref 150–400)
RBC: 3.99 MIL/uL (ref 3.87–5.11)
RDW: 13.1 % (ref 11.5–15.5)
WBC: 7 10*3/uL (ref 4.0–10.5)
nRBC: 0 % (ref 0.0–0.2)

## 2021-08-24 LAB — VITAMIN B12: Vitamin B-12: 282 pg/mL (ref 180–914)

## 2021-08-24 LAB — FERRITIN: Ferritin: 569 ng/mL — ABNORMAL HIGH (ref 11–307)

## 2021-08-24 MED ORDER — AMOXICILLIN-POT CLAVULANATE 875-125 MG PO TABS: 1.0000 | ORAL_TABLET | Freq: Two times a day (BID) | ORAL | 0 refills | Status: DC

## 2021-08-24 MED ORDER — GUAIFENESIN-DM 100-10 MG/5ML PO SYRP: 5.0000 mL | ORAL_SOLUTION | ORAL | 0 refills | Status: DC | PRN

## 2021-08-24 NOTE — TOC Transition Note (Addendum)
Transition of Care [facility]) - CM/SW Discharge Note   Patient Details  Name: OVETA IDRIS MRN: [id number] Date of Birth: July 02, 1990  [facility] CM/SW Contact:  Glennon Mac, RN Phone Number: 08/24/2021, 11:54 AM   Clinical Narrative:    Pt is a 31 y/o female admitted secondary to cough, fever, and SOB. Found to have PNA. No pertinent PMH. PTA, pt independent and living at home with spouse.  PT/OT recommending no OP follow up or DME, but patient with noted O2 desaturation with ambulation.  MD order received for home oxygen; referral to [employer] for home O2 set up. Portable unit to be delivered to patient's bedside prior to dc.    Final next level of care: Home/Self Care Barriers to Discharge: Barriers Resolved   Patient Goals and CMS Choice Patient states their goals for this hospitalization and ongoing recovery are:: to go home                            Discharge Plan and Services   Discharge Planning Services: CM Consult            DME Arranged: Oxygen   Date DME Agency Contacted: 08/24/21 Time DME Agency Contacted: 1153 Representative spoke with at DME Agency: Oletha Cruel            Social Determinants of Health (SDOH) Interventions     Readmission Risk Interventions     No data to display         Quintella Baton, RN, BSN  Trauma/Neuro ICU Case Manager [phone]

## 2021-08-24 NOTE — Progress Notes (Signed)
Discharge instructions provided to patient, patient verbalizes understanding. Patient discharged with incentive spirometer and home oxygen. Work note provided to patient. Patient discharged

## 2021-08-24 NOTE — Discharge Summary (Addendum)
Name: Victoria [name] MRN: [id number] DOB: 1990-09-30 31 y.o. PCP: Pcp, No  Date of Admission: 08/22/2021  1:14 PM Date of Discharge: 08/24/21 Attending Physician: Dr. Antony Contras  Discharge Diagnosis: Principal Problem:   CAP (community acquired pneumonia)    Discharge Medications: Allergies as of 08/24/2021       Reactions   Raspberry Swelling        Medication List     STOP taking these medications    ibuprofen 600 MG tablet Commonly known as: ADVIL       TAKE these medications    amoxicillin-clavulanate 875-125 MG tablet Commonly known as: AUGMENTIN Take 1 tablet by mouth every 12 (twelve) hours.   guaiFENesin-dextromethorphan 100-10 MG/5ML syrup Commonly known as: ROBITUSSIN DM Take 5 mLs by mouth every 4 (four) hours as needed for cough.               Durable Medical Equipment  (From admission, onward)           Start     Ordered   08/24/21 1123  For home use only DME oxygen  Once       Question Answer Comment  Length of Need 6 Months   Mode or (Route) Nasal cannula   Liters per Minute 3   Oxygen delivery system Gas      08/24/21 1123            Disposition and follow-up:   Ms.Victoria [name] was discharged from [facility] in Stable condition.  At the hospital follow up visit please address:  1.  Follow-up: a. Pneumonia- treated with 3 days of azithromycin 500 mg and 5 days of augmentin.   B. Acute hypoxic respiratory failure- ambulated with PT and needed 3L [place], please recheck ambulatory saturations at follow-up in one week.   c. Normocytic anemia- iron studies and folate wnl, B12 borderline  2.  Labs / imaging needed at time of follow-up: CBC, MMA, ambulatory saturations  3.  Pending labs/ test needing follow-up: none  4.  Medication Changes  Abx -  Augmentin End Date: 6/22  Follow-up Appointments: [facility] 6/29 at 1:15PM  Hospital Course by problem list: Community Acquired Pneumonia Acute hypoxic  respiratory failure Patient present with shortness of breath and hypoxia with ambulation. CXR showed right middle lobe consolidation. Respiratory panel was negative and COVID negative. She received IV antibiotics and then transitioned to PO antibiotics with Augmentin and Azithromycin. She received 3 doses of azithromycin 500 mg in the hospital. Last dose of augmentin on 6/22. She worked with Physical therapy and was sent home on 3L supplemental oxygen.  Possible right heart strain EKG showed S1Q3T3 pattern concerning for right heart strain. This has been seen on prior EKGs. 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 Echo showed normal EF with normal right ventricular systolic function and right atrial pressure of 8.  Normocytic anemia Hgb at 11. Iron studies within normal limits. B12 was at 282, borderline low. Please consider getting MMA at follow-up.  Hypokalemia Potassium at 3.2, resolved after supplementation.  Hypoalbuminemia Albumin at 3.3 on admission. Calcium corrected is within normal limits. Likely related to inflammation from acute illness     Discharge Subjective: Patient assessed at bedside this AM. She states that she is feeling better than yesterday. She was able to walk to bathroom and back without feeling short of breath. She continues to have a cough, but better with robitussin. She was updated on the plan for discharge and  additional antibiotic doses.   Discharge Exam:   BP 131/87 (BP Location: Right Arm)   Pulse 87   Temp 98.2 F (36.8 C) (Oral)   Resp 16   SpO2 94%  Constitutional: well-appearing, sitting up in chair at bedside Cardiovascular: regular rate and rhythm, no m/r/g Pulmonary/Chest: normal work of breathing on room air, lungs clear to auscultation bilaterally Abdominal: soft, non-tender, non-distended MSK: normal bulk and tone Skin: warm and dry Psych: normal mood and affect   Pertinent Labs, Studies, and  Procedures:     Latest Ref Rng & Units 08/24/2021    3:15 AM 08/23/2021    5:44 AM 08/22/2021    1:50 PM  CBC  WBC 4.0 - 10.5 K/uL 7.0  8.1  9.9   Hemoglobin 12.0 - 15.0 g/dL 27.2  53.6  64.4   Hematocrit 36.0 - 46.0 % 33.3  32.7  37.8   Platelets 150 - 400 K/uL 276  231  240        Latest Ref Rng & Units 08/24/2021    3:15 AM 08/23/2021    5:44 AM 08/22/2021    1:50 PM  CMP  Glucose 70 - 99 mg/dL 96  034  742   BUN 6 - 20 mg/dL 13  10  7    Creatinine 0.44 - 1.00 mg/dL  5.95  6.38   Sodium 135 - 145 mmol/L 144  141  140   Potassium 3.5 - 5.1 mmol/L 3.7  3.2  3.9   Chloride 98 - 111 mmol/L 109  109  105   CO2 22 - 32 mmol/L 24  23  23    Calcium 8.9 - 10.3 mg/dL 9.1  8.4  8.7   Total Protein 6.5 - 8.1 g/dL   7.0   Total Bilirubin 0.3 - 1.2 mg/dL   1.0   Alkaline Phos 38 - 126 U/L   107   AST 15 - 41 U/L   21   ALT 0 - 44 U/L   29     ECHOCARDIOGRAM COMPLETE  Result Date: 08/23/2021    ECHOCARDIOGRAM REPORT   Patient Name:   Victoria [name] Date of Exam: 08/23/2021 Medical Rec #:  Linard Millers        Height:       71.0 in Accession #:    08/25/2021       Weight:       345.0 lb Date of Birth:  05-16-1990       BSA:          2.658 m Patient Age:    30 years         BP:           146/93 mmHg Patient Gender: F                HR:           94 bpm. Exam Location:  Inpatient Procedure: 2D Echo, Cardiac Doppler and Color Doppler Indications:    Abnormal ECG  History:        Patient has no prior history of Echocardiogram examinations.                 Signs/Symptoms:Shortness of Breath.  Sonographer:    [id number] RDCS (AE) Referring Phys: 03/04/1992 Ross Ludwig  Sonographer Comments: Patient is morbidly obese. Image acquisition challenging due to patient body habitus. IMPRESSIONS  1. Left ventricular ejection fraction, by estimation, is 60 to 65%. The left ventricle has normal function.  The left ventricle has no regional wall motion abnormalities. Left ventricular diastolic parameters were  normal.  2. Right ventricular systolic function is normal. The right ventricular size is normal.  3. The mitral valve is normal in structure. No evidence of mitral valve regurgitation. No evidence of mitral stenosis.  4. The aortic valve is normal in structure. Aortic valve regurgitation is not visualized. No aortic stenosis is present.  5. The inferior vena cava is dilated in size with >50% respiratory variability, suggesting right atrial pressure of 8 mmHg. FINDINGS  Left Ventricle: Left ventricular ejection fraction, by estimation, is 60 to 65%. The left ventricle has normal function. The left ventricle has no regional wall motion abnormalities. The left ventricular internal cavity size was normal in size. There is  no left ventricular hypertrophy. Left ventricular diastolic parameters were normal. Right Ventricle: The right ventricular size is normal. No increase in right ventricular wall thickness. Right ventricular systolic function is normal. Left Atrium: Left atrial size was normal in size. Right Atrium: Right atrial size was normal in size. Pericardium: There is no evidence of pericardial effusion. Mitral Valve: The mitral valve is normal in structure. No evidence of mitral valve regurgitation. No evidence of mitral valve stenosis. MV peak gradient, 3.9 mmHg. The mean mitral valve gradient is 2.0 mmHg. Tricuspid Valve: The tricuspid valve is normal in structure. Tricuspid valve regurgitation is trivial. No evidence of tricuspid stenosis. Aortic Valve: The aortic valve is normal in structure. Aortic valve regurgitation is not visualized. No aortic stenosis is present. Aortic valve mean gradient measures 2.0 mmHg. Aortic valve peak gradient measures 4.5 mmHg. Aortic valve area, by VTI measures 4.06 cm. Pulmonic Valve: The pulmonic valve was normal in structure. Pulmonic valve regurgitation is not visualized. No evidence of pulmonic stenosis. Aorta: The aortic root is normal in size and structure. Venous: The  inferior vena cava is dilated in size with greater than 50% respiratory variability, suggesting right atrial pressure of 8 mmHg. IAS/Shunts: No atrial level shunt detected by color flow Doppler.  LEFT VENTRICLE PLAX 2D LVIDd:         5.30 cm   Diastology LVIDs:         3.20 cm   LV e' medial:  8.40 cm/s LV PW:         1.20 cm   LV e' lateral: 13.40 cm/s LV IVS:        0.90 cm LVOT diam:     2.40 cm LV SV:         76 LV SV Index:   29 LVOT Area:     4.52 cm  RIGHT VENTRICLE             IVC RV Basal diam:  3.30 cm     IVC diam: 2.20 cm RV S prime:     11.20 cm/s TAPSE (M-mode): 1.9 cm LEFT ATRIUM             Index        RIGHT ATRIUM           Index LA diam:        3.50 cm 1.32 cm/m   RA Area:     16.90 cm LA Vol (A2C):   45.6 ml 17.15 ml/m  RA Volume:   45.70 ml  17.19 ml/m LA Vol (A4C):   44.8 ml 16.85 ml/m LA Biplane Vol: 46.6 ml 17.53 ml/m  AORTIC VALVE AV Area (Vmax):    4.31 cm AV Area (Vmean):  3.95 cm AV Area (VTI):     4.06 cm AV Vmax:           106.00 cm/s AV Vmean:          72.900 cm/s AV VTI:            0.187 m AV Peak Grad:      4.5 mmHg AV Mean Grad:      2.0 mmHg LVOT Vmax:         101.00 cm/s LVOT Vmean:        63.600 cm/s LVOT VTI:          0.168 m LVOT/AV VTI ratio: 0.90  AORTA Ao Root diam: 3.00 cm Ao Asc diam:  3.00 cm MITRAL VALVE MV Area VTI:  3.42 cm   SHUNTS MV Peak grad: 3.9 mmHg   Systemic VTI:  0.17 m MV Mean grad: 2.0 mmHg   Systemic Diam: 2.40 cm MV Vmax:      0.99 m/s MV Vmean:     62.3 cm/s Arvilla Meres MD Electronically signed by Arvilla Meres MD Signature Date/Time: 08/23/2021/5:38:52 PM    Final    DG Chest 2 View  Result Date: 08/22/2021 CLINICAL DATA:  Shortness of breath, cough and congestion. EXAM: CHEST - 2 VIEW COMPARISON:  08/18/2021 FINDINGS: Bilateral mid and lower lung airspace opacities are now noted. There appears to be elevation of the RIGHT hemidiaphragm suggesting RIGHT LOWER lung atelectasis/volume loss. There may be trace bilateral pleural  effusions present. There is no evidence of pneumothorax or acute bony abnormality. IMPRESSION: 1. Bilateral mid and lower lung airspace opacities suspicious for pneumonia. 2. Probable RIGHT LOWER lung atelectasis/volume loss. 3. Question trace effusions. Electronically Signed   By: Harmon Pier M.D.   On: 08/22/2021 14:39     Discharge Instructions: Discharge Instructions     Diet - low sodium heart healthy   Complete by: As directed    Discharge instructions   Complete by: As directed    Ms. Victoria [name],  You were recently admitted to [facility] for pneumonia. It will take your body some time to recover from this infection.   Continue taking your home medications with the following changes  1. Start taking       -Augmentin, 2 times a day last dose on 6/22 2. You are being sent home with supplemental oxygen. Please wear this and we will see how you are doing next week when we see you in clinic.  You should seek further medical care if breathing worsens.  Follow-up with [facility] on 6/29 at 1:15 with Dr. Welton Flakes.  Sincerely, Rudene Christians, DO   Increase activity slowly   Complete by: As directed        Signed: Rudene Christians, DO 08/24/2021, 11:30 AM   Pager: [phone]

## 2021-08-24 NOTE — Progress Notes (Signed)
Physical Therapy Treatment Patient Details Name: Victoria [name] MRN: [id number] DOB: 18-Nov-1990 Today's Date: 08/24/2021   History of Present Illness Pt is a 31 y/o female admitted secondary to cough, fever, and SOB. Found to have PNA. No pertinent PMH.    PT Comments    Pt seated in reclined eating a bagel.  She reports she is feeling better and ready to go home.  She reports her breathing is better if she remains calm.  Pt coughing throughout session with intermittent productive coughs. SPo2 decreased to 84% on RA during first trial.  She required 3L [place] to maintain 88%-92%. Informed RN and MD of patient's oxygen needs.  '    Recommendations for follow up therapy are one component of a multi-disciplinary discharge planning process, led by the attending physician.  Recommendations may be updated based on patient status, additional functional criteria and insurance authorization.  Follow Up Recommendations  No PT follow up     Assistance Recommended at Discharge Intermittent Supervision/Assistance  Patient can return home with the following Assistance with cooking/housework   Equipment Recommendations  None recommended by PT    Recommendations for Other Services       Precautions / Restrictions Precautions Precautions: Other (comment) Precaution Comments: watch O2 Restrictions Weight Bearing Restrictions: No     Mobility  Bed Mobility               General bed mobility comments: sitting in recliner    Transfers   Equipment used: None Transfers: Sit to/from Stand Sit to Stand: Modified independent (Device/Increase time)                Ambulation/Gait Ambulation/Gait assistance: Supervision Gait Distance (Feet): 20 Feet (x2) Assistive device: None Gait Pattern/deviations: Step-through pattern, Decreased stride length       General Gait Details: Pt with poor activity tolerance due to breathing and desaturation with acitivity.  She can recover after  dropping to 84% on RA with a seated rest break.  Performed additional trial with supplemental O2 via [place] at 3L and able to maintain sats 88%-92%.   Stairs             Wheelchair Mobility    Modified Rankin (Stroke Patients Only)       Balance Overall balance assessment: Mild deficits observed, not formally tested                                          Cognition Arousal/Alertness: Awake/alert Behavior During Therapy: WFL for tasks assessed/performed Overall Cognitive Status: Within Functional Limits for tasks assessed                                 General Comments: anxious due to breathing after activity.        Exercises      General Comments        Pertinent Vitals/Pain Pain Assessment Pain Assessment: No/denies pain    Home Living                          Prior Function            PT Goals (current goals can now be found in the care plan section) Acute Rehab PT Goals Patient Stated Goal: to feel better Potential to Achieve Goals:  Good Progress towards PT goals: Progressing toward goals    Frequency    Min 3X/week      PT Plan Current plan remains appropriate    Co-evaluation              AM-PAC PT "6 Clicks" Mobility   Outcome Measure  Help needed turning from your back to your side while in a flat bed without using bedrails?: None Help needed moving from lying on your back to sitting on the side of a flat bed without using bedrails?: None Help needed moving to and from a bed to a chair (including a wheelchair)?: A Little Help needed standing up from a chair using your arms (e.g., wheelchair or bedside chair)?: A Little Help needed to walk in hospital room?: A Little Help needed climbing 3-5 steps with a railing? : A Little 6 Click Score: 20    End of Session Equipment Utilized During Treatment: Gait belt Activity Tolerance: Treatment limited secondary to medical complications  (Comment) (increased SHOB)   Nurse Communication: Mobility status PT Visit Diagnosis: Other abnormalities of gait and mobility (R26.89)     Time: 8416-6063 PT Time Calculation (min) (ACUTE ONLY): 23 min  Charges:  $Therapeutic Activity: 23-37 mins                     Victoria [name] , PTA Acute Rehabilitation Services Pager [phone] Office [phone]    Victoria [name] Artis Delay 08/24/2021, 11:26 AM

## 2021-08-24 NOTE — Progress Notes (Signed)
SATURATION QUALIFICATIONS: (This note is used to comply with regulatory documentation for home oxygen)  Patient Saturations on Room Air at Rest = 92%  Patient Saturations on Room Air while Ambulating = 84%  Patient Saturations on 3 Liters of oxygen while Ambulating = 92%  Please briefly explain why patient needs home oxygen: desat with activity  Victoria Biela R. , PTA Acute Rehabilitation Services

## 2021-09-02 ENCOUNTER — Ambulatory Visit (INDEPENDENT_AMBULATORY_CARE_PROVIDER_SITE_OTHER): Payer: PRIVATE HEALTH INSURANCE | Admitting: Internal Medicine

## 2021-09-02 VITALS — BP 117/48 | HR 95 | Temp 98.0°F | Ht 71.0 in | Wt 347.5 lb

## 2021-09-02 DIAGNOSIS — Z6841 Body Mass Index (BMI) 40.0 and over, adult: Secondary | ICD-10-CM

## 2021-09-02 DIAGNOSIS — J189 Pneumonia, unspecified organism: Secondary | ICD-10-CM

## 2021-09-02 DIAGNOSIS — Z1159 Encounter for screening for other viral diseases: Secondary | ICD-10-CM

## 2021-09-02 DIAGNOSIS — Z23 Encounter for immunization: Secondary | ICD-10-CM

## 2021-09-02 DIAGNOSIS — D649 Anemia, unspecified: Secondary | ICD-10-CM

## 2021-09-02 DIAGNOSIS — Z299 Encounter for prophylactic measures, unspecified: Secondary | ICD-10-CM | POA: Diagnosis not present

## 2021-09-02 NOTE — Patient Instructions (Addendum)
Victoria [name], it was a pleasure seeing you today! You endorsed feeling well today. Below are some of the things we talked about this visit. We look forward to seeing you in the follow up appointment!  Today we discussed: You presented to the clinic for hospital follow-up as you were admitted for pneumonia.  He stated you completed all the antibiotics and are feeling back to your normal self.  You walked in the clinic without your oxygen dropping, so you do not need to wear oxygen at home.  For your cough you can take Robitussin as needed and that should clear up in a few days. For your anemia, we will do some more testing to exactly what the underlying causes for your anemia. We will do some other labs and give you a call with the results once we get that back. We also gave you a tetanus shot today.   I have ordered the following labs today:   Lab Orders         Hepatitis C Antibody         CBC with Diff         Methylmalonic acid(mma), rnd urine       Referrals ordered today:   Referral Orders  No referral(s) requested today     I have ordered the following medication/changed the following medications:   Stop the following medications: There are no discontinued medications.   Start the following medications: No orders of the defined types were placed in this encounter.    Follow-up: 6 months or earlier as needed.   Please make sure to arrive 15 minutes prior to your next appointment. If you arrive late, you may be asked to reschedule.   We look forward to seeing you next time. Please call our clinic at [phone] if you have any questions or concerns. The best time to call is Monday-Friday from 9am-4pm, but there is someone available 24/7. If after hours or the weekend, call the main hospital number and ask for the Internal Medicine Resident On-Call. If you need medication refills, please notify your pharmacy one week in advance and they will send [place] a request.  Thank  you for letting us take part in your care. Wishing you the best!  Thank you, Gwenevere Abbot, MD

## 2021-09-02 NOTE — Progress Notes (Signed)
   CC: Hospital follow up  HPI:  Victoria [name] is a 31 y.o. with medical history as below presenting to [facility] for hospital follow up.   Please see problem-based list for further details, assessments, and plans.  Past Medical History:  Diagnosis Date   ADHD (attention deficit hyperactivity disorder)    Migraines    Ovarian cyst    on right side      Current Outpatient Medications (Respiratory):    guaiFENesin-dextromethorphan (ROBITUSSIN DM) 100-10 MG/5ML syrup, Take 5 mLs by mouth every 4 (four) hours as needed for cough.    Current Outpatient Medications (Other):    amoxicillin-clavulanate (AUGMENTIN) 875-125 MG tablet, Take 1 tablet by mouth every 12 (twelve) hours.  Review of Systems:  Review of system negative unless stated in the problem list or HPI.    Physical Exam:  Vitals:   09/02/21 1318  BP: (!) 117/48  Pulse: 95  Temp: 98 F (36.7 C)  TempSrc: Oral  SpO2: 99%  Weight: (!) 347 lb 8 oz (157.6 kg)  Height: 5\' 11"  (1.803 m)    Physical Exam General: NAD HENT: NCAT Lungs: CTAB, no wheeze, rhonchi or rales.  Cardiovascular: Normal heart sounds, no r/m/g, 2+ pulses in all extremities. No LE edema Abdomen: No TTP, normal bowel sounds MSK: No asymmetry or muscle atrophy.  Skin: no lesions noted on exposed skin Neuro: Alert and oriented x4. CN grossly intact Psych: Normal mood and normal affect   Assessment & Plan:   See Encounters Tab for problem based charting.  Patient discussed with Dr. , MD

## 2021-09-03 NOTE — Progress Notes (Signed)
Internal Medicine Clinic Attending  Case discussed with Dr. Khan  At the time of the visit.  We reviewed the resident's history and exam and pertinent patient test results.  I agree with the assessment, diagnosis, and plan of care documented in the resident's note.  

## 2021-09-04 DIAGNOSIS — Z299 Encounter for prophylactic measures, unspecified: Secondary | ICD-10-CM | POA: Insufficient documentation

## 2021-09-04 DIAGNOSIS — D649 Anemia, unspecified: Secondary | ICD-10-CM | POA: Insufficient documentation

## 2021-09-04 NOTE — Assessment & Plan Note (Signed)
Patient presented for a follow up after hospitalization. She completed her antibiotics and feels states her symptoms have resolved. She was discharged with supplemental oxygen but is not using it. She was ambulated with pulse ox and had no desaturations.

## 2021-09-04 NOTE — Assessment & Plan Note (Signed)
Counseled on weight loss to prevent disease and promote health.

## 2021-09-04 NOTE — Assessment & Plan Note (Addendum)
TDAP given this visit. She states she had pap-smear recently at [facility], Dr. Marcelle Overlie. She will get records for the [place].

## 2021-09-04 NOTE — Assessment & Plan Note (Addendum)
Patient had anemia noted on lab work during her hospitalization. Hgb 11.3, MCV 83. Iron panel, ferritin, and folate wnl. VB12 was equivocal so MMA ordered this visit. CBC was ordered and showed anemia resolved.  -MMA still pending, will follow up on result, most likely will be normal given normal MCV and resolution of anemia.

## 2021-09-06 ENCOUNTER — Encounter: Payer: Self-pay | Admitting: Internal Medicine

## 2021-09-06 LAB — CBC WITH DIFFERENTIAL/PLATELET
Basophils Absolute: 0 10*3/uL (ref 0.0–0.2)
Basos: 0 %
EOS (ABSOLUTE): 0.1 10*3/uL (ref 0.0–0.4)
Eos: 1 %
Hematocrit: 38.8 % (ref 34.0–46.6)
Hemoglobin: 13.3 g/dL (ref 11.1–15.9)
Immature Grans (Abs): 0.1 10*3/uL (ref 0.0–0.1)
Immature Granulocytes: 1 %
Lymphocytes Absolute: 2.5 10*3/uL (ref 0.7–3.1)
Lymphs: 27 %
MCH: 28.8 pg (ref 26.6–33.0)
MCHC: 34.3 g/dL (ref 31.5–35.7)
MCV: 84 fL (ref 79–97)
Monocytes Absolute: 0.6 10*3/uL (ref 0.1–0.9)
Monocytes: 6 %
Neutrophils Absolute: 6.1 10*3/uL (ref 1.4–7.0)
Neutrophils: 65 %
Platelets: 313 10*3/uL (ref 150–450)
RBC: 4.62 x10E6/uL (ref 3.77–5.28)
RDW: 13.9 % (ref 11.7–15.4)
WBC: 9.4 10*3/uL (ref 3.4–10.8)

## 2021-09-06 LAB — HEPATITIS C ANTIBODY: Hep C Virus Ab: NONREACTIVE

## 2021-09-06 LAB — METHYLMALONIC ACID, SERUM: Methylmalonic Acid: 162 nmol/L (ref 0–378)

## 2021-09-09 ENCOUNTER — Telehealth: Payer: Self-pay | Admitting: *Deleted

## 2021-09-09 NOTE — Telephone Encounter (Signed)
Patient called in requesting order to d/c home oxygen. States Oxygen supplier cannot p/u equipment till an order is written. Patient seen on 6/29 as HFU and did not desat while ambulating.

## 2021-09-13 ENCOUNTER — Other Ambulatory Visit (INDEPENDENT_AMBULATORY_CARE_PROVIDER_SITE_OTHER): Payer: PRIVATE HEALTH INSURANCE | Admitting: Internal Medicine

## 2021-09-13 DIAGNOSIS — J189 Pneumonia, unspecified organism: Secondary | ICD-10-CM

## 2021-09-13 NOTE — Telephone Encounter (Signed)
CM sent to Colletta [place] at [employer] that there is an order in Epic to d/c home oxygen.

## 2021-09-13 NOTE — Progress Notes (Signed)
Order placed to allow patient to discontinue home oxygen.

## 2021-09-13 NOTE — Telephone Encounter (Signed)
Please place a home oxygen order and in the Comments write, "Discontinue home oxygen." Please let Mamie or I know so we can contact [employer]. Thank you.

## 2021-09-20 ENCOUNTER — Telehealth: Payer: Self-pay | Admitting: *Deleted

## 2021-09-20 NOTE — Telephone Encounter (Signed)
RTC to patient regarding need to discontinue Oxygen.  Message was sent and received to [employer].  Reminder again sent today that Oxygen has been discontinued.  Patient was called and informed of.

## 2023-05-02 NOTE — H&P (Signed)
 Preoperative History & Physical Exam  Surgeon: Philipp Ovens, MD  Diagnosis: Carpal tunnel syndrome of right wrist  Planned Procedure: Procedure(s) (LRB): CARPAL TUNNEL RELEASE (Right)  History of Present Illness:   Patient is a 33 y.o. female with symptoms consistent with Carpal tunnel syndrome of right wrist who presents for surgical intervention. The risks, benefits and alternatives of surgical intervention were discussed and informed consent was obtained prior to surgery.  Past Medical History:  Past Medical History:  Diagnosis Date   ADHD (attention deficit hyperactivity disorder)    Migraines    Ovarian cyst    on right side    Past Surgical History:  Past Surgical History:  Procedure Laterality Date   WISDOM TOOTH EXTRACTION      Medications:  Prior to Admission medications   Medication Sig Start Date End Date Taking? Authorizing Provider  levonorgestrel (MIRENA, 52 MG,) 20 MCG/DAY IUD 1 each by Intrauterine route once. 12/19/16  Yes [provider]    Allergies:  Raspberry  Review of Systems: Negative except per HPI.  Physical Exam: Alert and oriented, NAD Head and neck: no masses, normal alignment CV: pulse intact Pulm: no increased work of breathing, respirations even and unlabored Abdomen: non-distended Extremities: extremities warm and well perfused  LABS: No results found for this or any previous visit (from the past 2160 hours).   Complete History and Physical exam available in the office notes  Nolberto Hanlon Alyson Ki

## 2023-05-05 ENCOUNTER — Ambulatory Visit (HOSPITAL_COMMUNITY): Admission: RE | Admit: 2023-05-05 | Payer: 59 | Source: Home / Self Care | Admitting: Orthopedic Surgery

## 2023-05-05 ENCOUNTER — Encounter (HOSPITAL_COMMUNITY): Admission: RE | Payer: Self-pay | Source: Home / Self Care

## 2023-05-05 SURGERY — CARPAL TUNNEL RELEASE
Anesthesia: Monitor Anesthesia Care | Laterality: Right

## 2024-05-15 IMAGING — CR DG CHEST 2V
2 series · 2 of 2 positions shown · non-contrast
Comparison: 08/18/2021

CLINICAL DATA: Shortness of breath, cough and congestion.

EXAM:
CHEST - 2 VIEW

[chest pa]
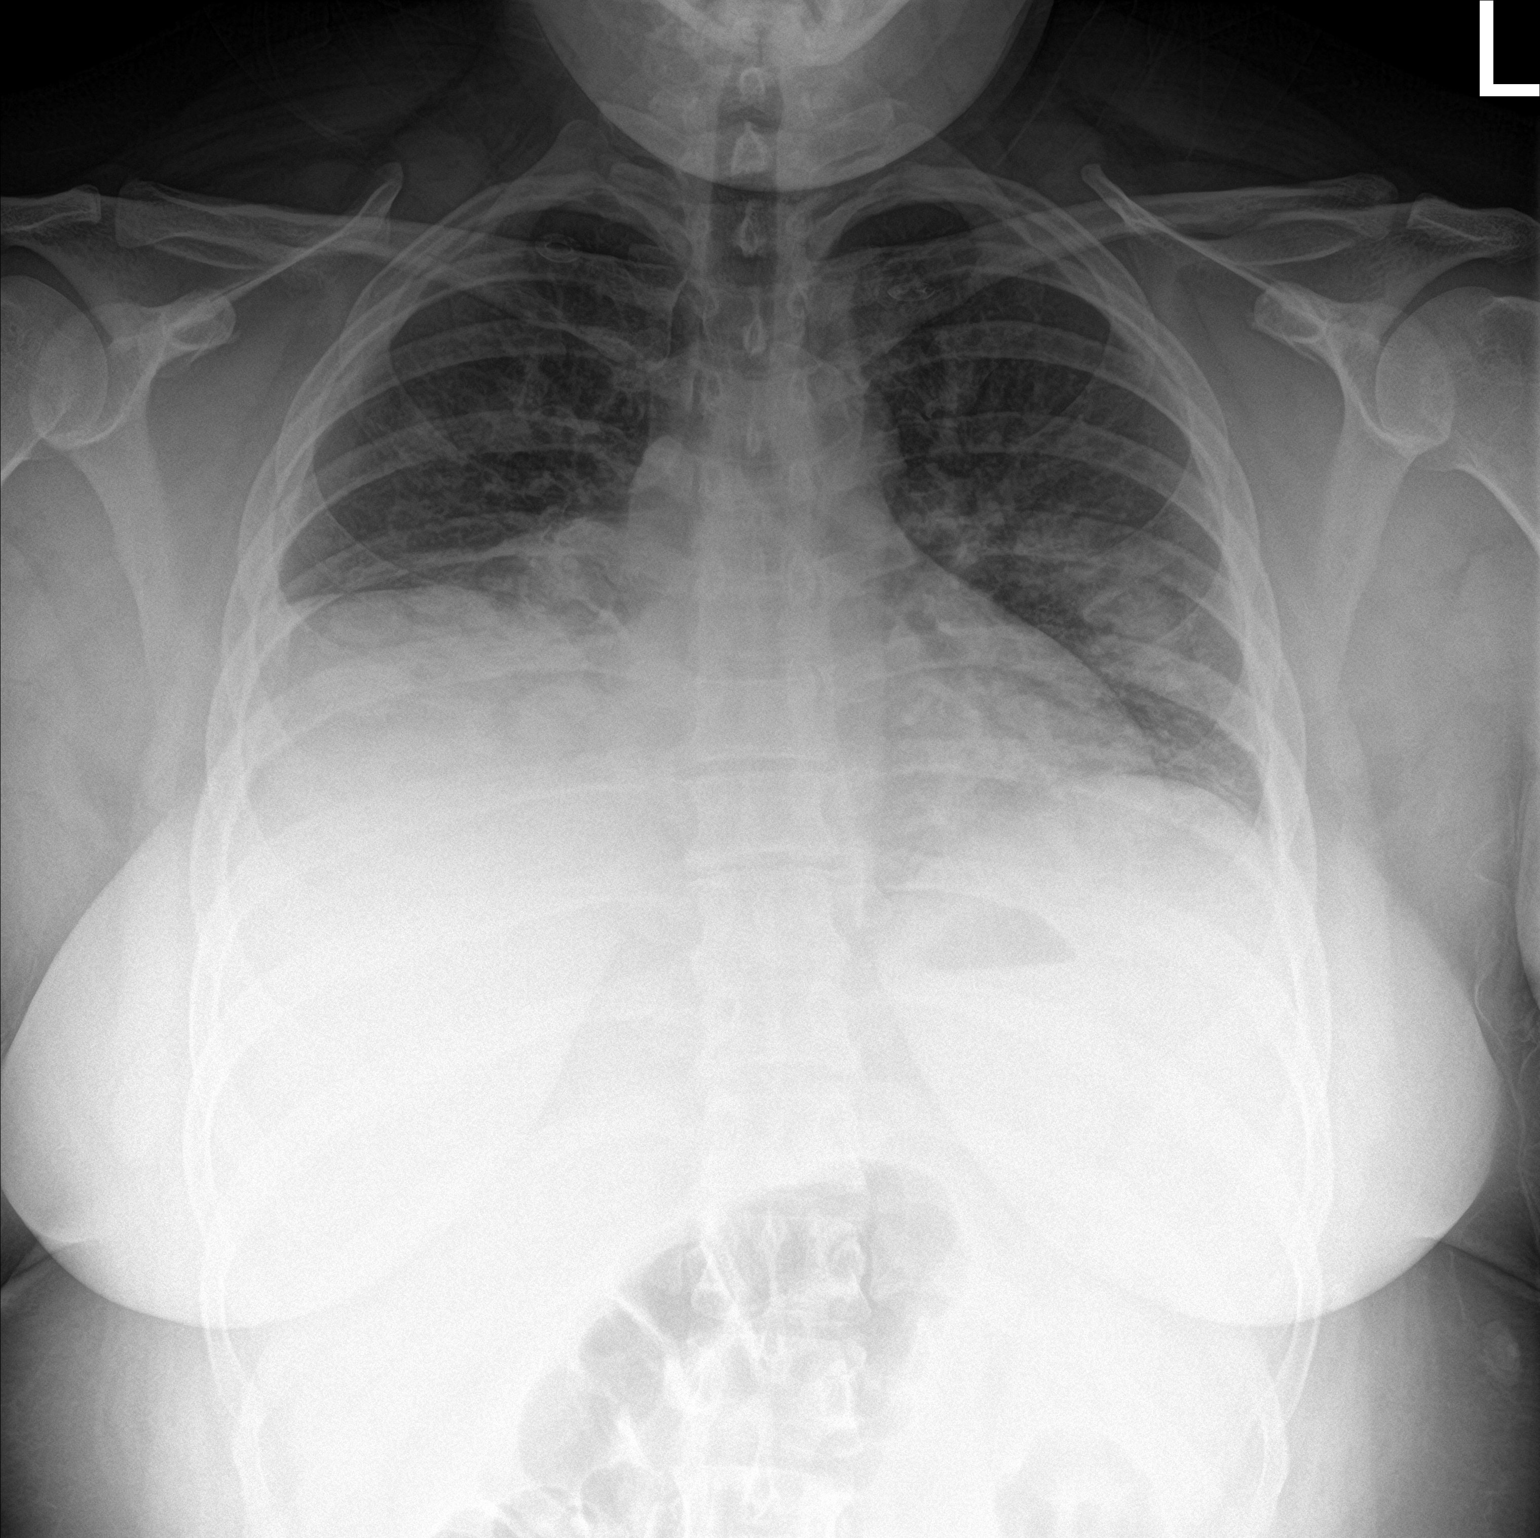

[chest lat]
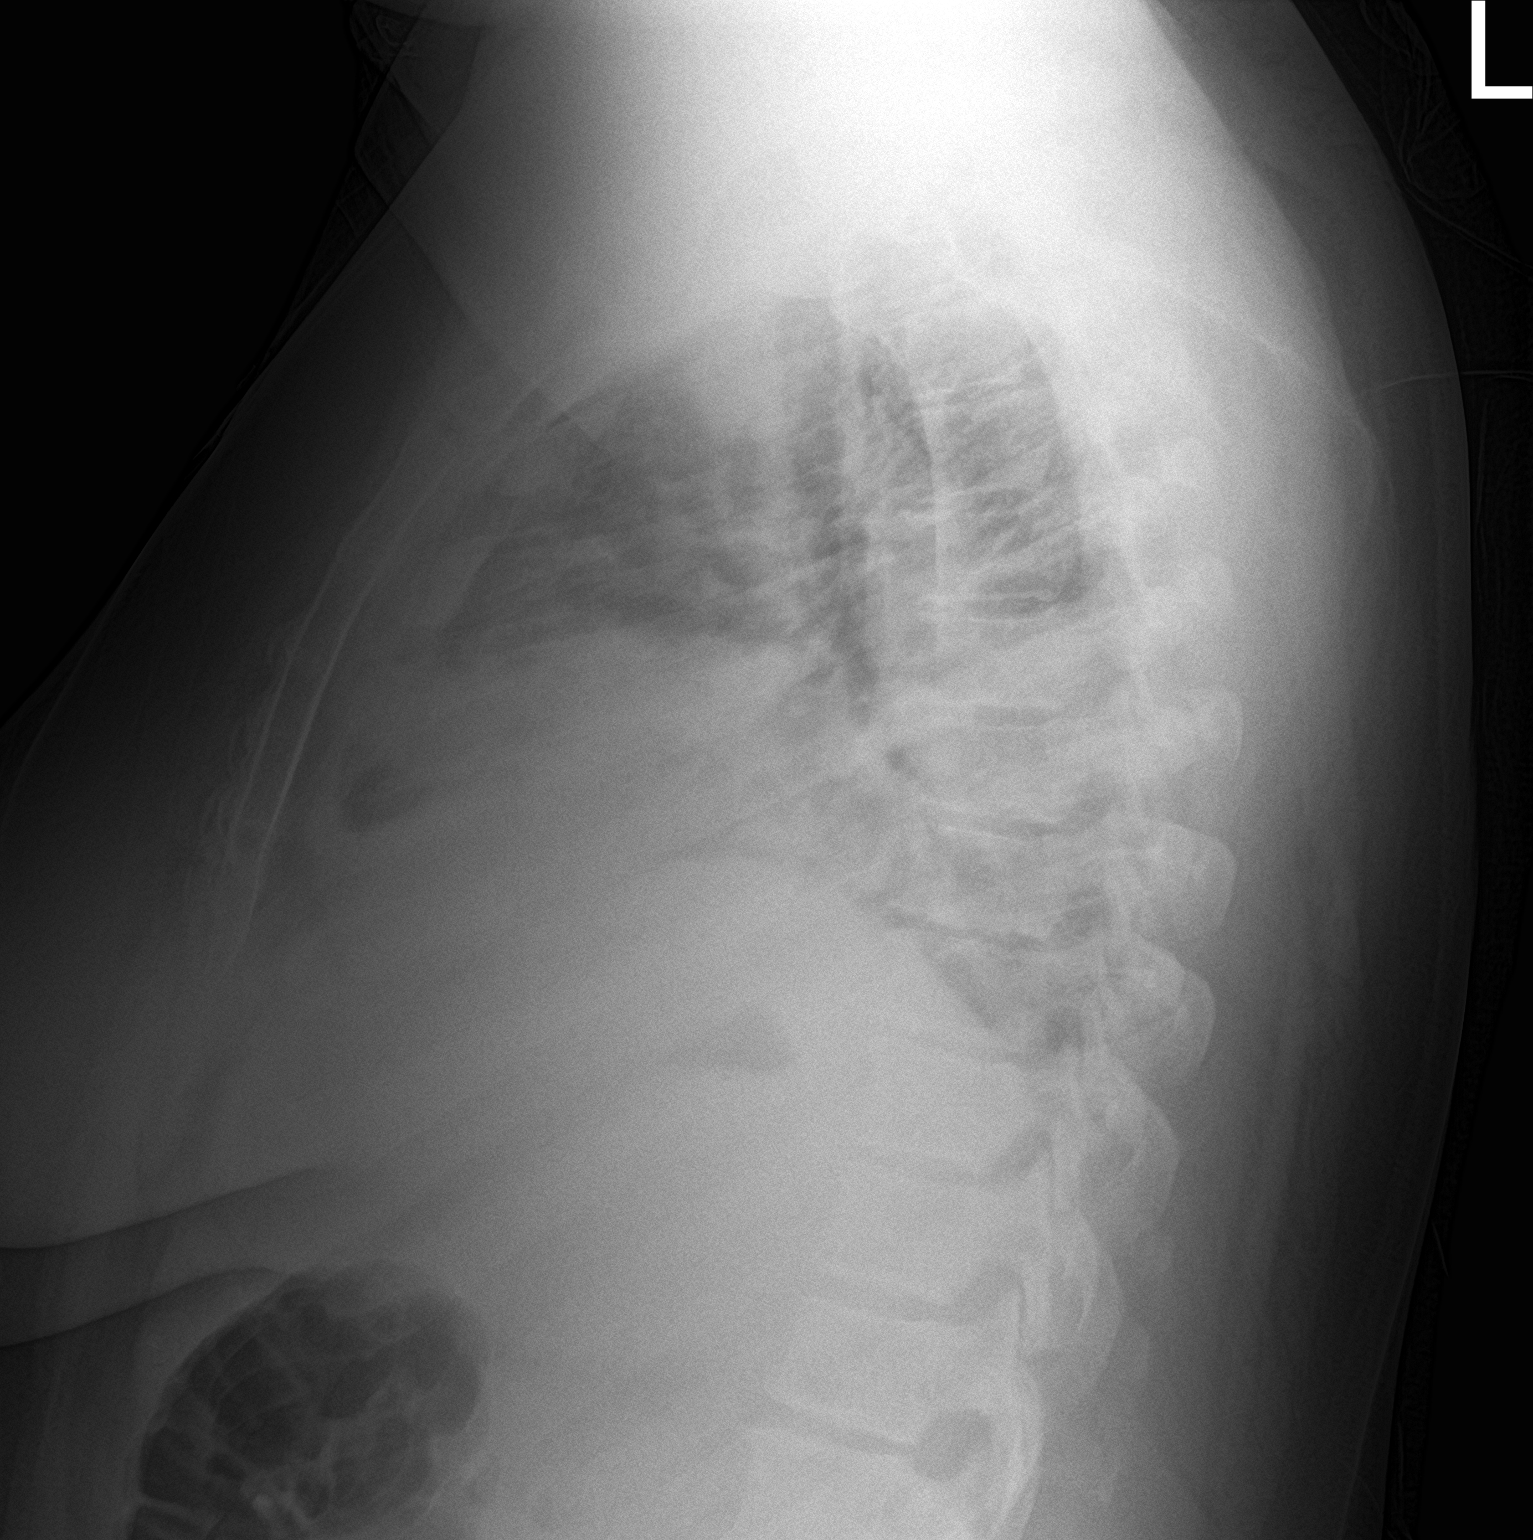

[2 of 2 positions shown; findings below may reference images not displayed]

FINDINGS: Bilateral mid and lower lung airspace opacities are now noted.

There appears to be elevation of the RIGHT hemidiaphragm suggesting
RIGHT LOWER lung atelectasis/volume loss.

There may be trace bilateral pleural effusions present.

There is no evidence of pneumothorax or acute bony abnormality.
IMPRESSION: 1. Bilateral mid and lower lung airspace opacities suspicious for
pneumonia.
2. Probable RIGHT LOWER lung atelectasis/volume loss.
3. Question trace effusions.
